# Patient Record
Sex: Female | Born: 1935 | Race: Black or African American | Hispanic: No | Smoking: Never smoker
Health system: Southern US, Community
[De-identification: ages and names within clinical notes are randomized; demographics above are authoritative.]

## PROBLEM LIST (undated history)

## (undated) DIAGNOSIS — M8580 Other specified disorders of bone density and structure, unspecified site: Secondary | ICD-10-CM

## (undated) DIAGNOSIS — M1990 Unspecified osteoarthritis, unspecified site: Secondary | ICD-10-CM

## (undated) DIAGNOSIS — E785 Hyperlipidemia, unspecified: Secondary | ICD-10-CM

## (undated) DIAGNOSIS — I10 Essential (primary) hypertension: Secondary | ICD-10-CM

## (undated) DIAGNOSIS — S7292XA Unspecified fracture of left femur, initial encounter for closed fracture: Secondary | ICD-10-CM

## (undated) DIAGNOSIS — I350 Nonrheumatic aortic (valve) stenosis: Secondary | ICD-10-CM

## (undated) HISTORY — PX: TOTAL HIP ARTHROPLASTY: SHX124

## (undated) HISTORY — PX: ABDOMINAL HYSTERECTOMY: SHX81

---

## 1999-05-14 ENCOUNTER — Ambulatory Visit (HOSPITAL_COMMUNITY): Admission: RE | Admit: 1999-05-14 | Discharge: 1999-05-14 | Payer: Self-pay | Admitting: *Deleted

## 1999-05-25 ENCOUNTER — Ambulatory Visit (HOSPITAL_COMMUNITY): Admission: RE | Admit: 1999-05-25 | Discharge: 1999-05-25 | Payer: Self-pay | Admitting: *Deleted

## 2001-04-17 ENCOUNTER — Ambulatory Visit (HOSPITAL_COMMUNITY): Admission: RE | Admit: 2001-04-17 | Discharge: 2001-04-17 | Payer: Self-pay | Admitting: *Deleted

## 2001-09-07 ENCOUNTER — Encounter: Admission: RE | Admit: 2001-09-07 | Discharge: 2001-09-07 | Payer: Self-pay | Admitting: *Deleted

## 2003-06-20 ENCOUNTER — Inpatient Hospital Stay (HOSPITAL_COMMUNITY): Admission: RE | Admit: 2003-06-20 | Discharge: 2003-06-24 | Payer: Self-pay | Admitting: Orthopaedic Surgery

## 2003-06-24 ENCOUNTER — Inpatient Hospital Stay (HOSPITAL_COMMUNITY)
Admission: RE | Admit: 2003-06-24 | Discharge: 2003-07-03 | Payer: Self-pay | Admitting: Physical Medicine & Rehabilitation

## 2005-04-29 ENCOUNTER — Inpatient Hospital Stay (HOSPITAL_COMMUNITY): Admission: RE | Admit: 2005-04-29 | Discharge: 2005-05-04 | Payer: Self-pay | Admitting: Orthopaedic Surgery

## 2005-04-29 ENCOUNTER — Ambulatory Visit: Payer: Self-pay | Admitting: Physical Medicine & Rehabilitation

## 2005-05-04 ENCOUNTER — Inpatient Hospital Stay
Admission: RE | Admit: 2005-05-04 | Discharge: 2005-05-13 | Payer: Self-pay | Admitting: Physical Medicine & Rehabilitation

## 2007-12-14 ENCOUNTER — Encounter: Admission: RE | Admit: 2007-12-14 | Discharge: 2007-12-14 | Payer: Self-pay | Admitting: Internal Medicine

## 2007-12-20 ENCOUNTER — Encounter: Admission: RE | Admit: 2007-12-20 | Discharge: 2007-12-20 | Payer: Self-pay | Admitting: Internal Medicine

## 2009-01-26 ENCOUNTER — Ambulatory Visit: Payer: Self-pay | Admitting: Internal Medicine

## 2009-01-27 ENCOUNTER — Encounter (INDEPENDENT_AMBULATORY_CARE_PROVIDER_SITE_OTHER): Payer: Self-pay | Admitting: Internal Medicine

## 2009-01-27 ENCOUNTER — Inpatient Hospital Stay (HOSPITAL_COMMUNITY): Admission: EM | Admit: 2009-01-27 | Discharge: 2009-01-30 | Payer: Self-pay | Admitting: Emergency Medicine

## 2009-01-27 ENCOUNTER — Ambulatory Visit: Payer: Self-pay | Admitting: Vascular Surgery

## 2009-01-28 ENCOUNTER — Encounter (INDEPENDENT_AMBULATORY_CARE_PROVIDER_SITE_OTHER): Payer: Self-pay | Admitting: Internal Medicine

## 2011-03-10 LAB — COMPREHENSIVE METABOLIC PANEL
AST: 22 U/L (ref 0–37)
Albumin: 2.9 g/dL — ABNORMAL LOW (ref 3.5–5.2)
CO2: 27 mEq/L (ref 19–32)
Calcium: 8.6 mg/dL (ref 8.4–10.5)
Creatinine, Ser: 0.7 mg/dL (ref 0.4–1.2)
GFR calc non Af Amer: >60 mL/min (ref >60)

## 2011-03-10 LAB — URINE CULTURE

## 2011-03-10 LAB — GLUCOSE, CAPILLARY
Glucose-Capillary: 104 mg/dL — ABNORMAL HIGH (ref 70–99)
Glucose-Capillary: 111 mg/dL — ABNORMAL HIGH (ref 70–99)
Glucose-Capillary: 121 mg/dL — ABNORMAL HIGH (ref 70–99)
Glucose-Capillary: 131 mg/dL — ABNORMAL HIGH (ref 70–99)
Glucose-Capillary: 132 mg/dL — ABNORMAL HIGH (ref 70–99)

## 2011-03-10 LAB — DIFFERENTIAL
Basophils Absolute: 0 10*3/uL (ref 0.0–0.1)
Basophils Absolute: 0 10*3/uL (ref 0.0–0.1)
Basophils Relative: 0 % (ref 0–1)
Eosinophils Relative: 0 % (ref 0–5)
Lymphocytes Relative: 8 % — ABNORMAL LOW (ref 12–46)
Monocytes Relative: 6 % (ref 3–12)
Neutro Abs: 4.3 10*3/uL (ref 1.7–7.7)
Neutro Abs: 9.5 10*3/uL — ABNORMAL HIGH (ref 1.7–7.7)
Neutrophils Relative %: 77 % (ref 43–77)
Neutrophils Relative %: 87 % — ABNORMAL HIGH (ref 43–77)

## 2011-03-10 LAB — URINALYSIS, ROUTINE W REFLEX MICROSCOPIC
Bilirubin Urine: NEGATIVE
Ketones, ur: NEGATIVE mg/dL
Nitrite: NEGATIVE
Specific Gravity, Urine: 1.011 (ref 1.005–1.030)
Urobilinogen, UA: 1 mg/dL (ref 0.0–1.0)
pH: 6.5 (ref 5.0–8.0)

## 2011-03-10 LAB — CBC
HCT: 34.1 % — ABNORMAL LOW (ref 36.0–46.0)
HCT: 39.2 % (ref 36.0–46.0)
MCHC: 34.4 g/dL (ref 30.0–36.0)
MCV: 92.7 fL (ref 78.0–100.0)
Platelets: 106 10*3/uL — ABNORMAL LOW (ref 150–400)
Platelets: 133 10*3/uL — ABNORMAL LOW (ref 150–400)
RBC: 3.87 MIL/uL (ref 3.87–5.11)
RDW: 14.2 % (ref 11.5–15.5)
RDW: 14.6 % (ref 11.5–15.5)
RDW: 14.7 % (ref 11.5–15.5)

## 2011-03-10 LAB — BASIC METABOLIC PANEL
BUN: 11 mg/dL (ref 6–23)
BUN: 15 mg/dL (ref 6–23)
Calcium: 8.5 mg/dL (ref 8.4–10.5)
Calcium: 9.3 mg/dL (ref 8.4–10.5)
Chloride: 104 mEq/L (ref 96–112)
Creatinine, Ser: 0.69 mg/dL (ref 0.4–1.2)
Creatinine, Ser: 0.78 mg/dL (ref 0.4–1.2)
GFR calc Af Amer: >60 mL/min (ref >60)
GFR calc non Af Amer: >60 mL/min (ref >60)
GFR calc non Af Amer: >60 mL/min (ref >60)

## 2011-03-10 LAB — CARDIAC PANEL(CRET KIN+CKTOT+MB+TROPI)
CK, MB: 2.6 ng/mL (ref 0.3–4.0)
CK, MB: 3.9 ng/mL (ref 0.3–4.0)

## 2011-03-10 LAB — TROPONIN I

## 2011-03-10 LAB — CK TOTAL AND CKMB (NOT AT ARMC): CK, MB: 6.1 ng/mL — ABNORMAL HIGH (ref 0.3–4.0)

## 2011-03-10 LAB — TSH: TSH: 1.326 u[IU]/mL (ref 0.350–4.500)

## 2011-03-10 LAB — URINE MICROSCOPIC-ADD ON

## 2011-03-10 LAB — PROTIME-INR
INR: 1 (ref 0.00–1.49)
Prothrombin Time: 13.9 seconds (ref 11.6–15.2)

## 2011-03-10 LAB — LIPID PANEL: Triglycerides: 49 mg/dL (ref <150)
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
## 2011-04-15 NOTE — Op Note (Signed)
[name], STALLONE                ACCOUNT NO.:  [id number]   MEDICAL RECORD NO.:  [id number]          PATIENT TYPE:  INP   LOCATION:  5016                         FACILITY:  [facility]   PHYSICIAN:  Mark C. Ophelia Charter, M.D.    DATE OF BIRTH:  21-Jun-1924   DATE OF PROCEDURE:  DATE OF DISCHARGE:  05/04/2005                                 OPERATIVE REPORT   FINAL DIAGNOSES:  Left hip osteoarthritis.   OPERATION/PROCEDURE:  Left total hip arthroplasty.   ADDITIONAL DIAGNOSES:  1.  Hypertension.  2.  Esophageal reflux.  3.  Anemia.   HISTORY OF PRESENT ILLNESS:  This 77 year old female has progressive left  hip osteoarthritis, painful with chronic pain . She had had fragmentation  and erosion and was admitted for left total hip arthroplasty.  Preoperative  lab work included CBC which was normal.  Normal PT/PTT. Hemoglobin 13.5 on  admission.  Urinalysis was normal.  Chemistry panel was normal.   HOSPITAL COURSE:  The patient was admitted and underwent a cemented femur  with Press-Fit acetabulum.  Dr. Rayburn Ma assisted with the surgery under  general anesthesia.  Estimated blood loss was 200 mL.  Postoperatively she  was on pharmacy Coumadin  protocol, PT/OT postoperative total hip  arthroplasty protocol, care and management arrangements.  She progressed  well.  INR was 2 on 4 mg of Coumadin a day.  Hemoglobin was 9.2 and stable  and arrangements were made with transfer to [facility].  She was slow with therapy  and had bilateral total hip arthroplasty at age 66.  The patient was  transferred in stable condition.  Postoperative x-ray showed good position  of prosthesis.      Mark C. Ophelia Charter, M.D.  Electronically Signed     MCY/MEDQ  D:  07/27/2005  T:  07/28/2005  Job:  [id number]

## 2011-04-15 NOTE — H&P (Signed)
NAMENOHELANI, BENNING NO.:  [id number]   MEDICAL RECORD NO.:  [id number]          PATIENT TYPE:  ORB   LOCATION:  4532                         FACILITY:  [facility]   PHYSICIAN:  Ranelle Oyster, M.D.DATE OF BIRTH:  12-17-1923   DATE OF ADMISSION:  05/04/2005  DATE OF DISCHARGE:                                HISTORY & PHYSICAL   CHIEF COMPLAINT:  Left hip pain.   HISTORY OF PRESENT ILLNESS:  This is a 77 year old black female with a  history of a right total hip replacement in 2004, who went to rehab, who was  admitted on June 2 with end-stage changes in her left hip, failing  conservative care.  The patient underwent a left total hip replacement on  June 2 by Dr. Ophelia Charter.  The patient was placed on Coumadin for DVT  prophylaxis.  She has had some mild postoperative anemia.  She is a mod-  assist for bed mobility, min-assist for gait 20 feet with rolling walker and  total assist for transfers.  She is brought to the subacute rehab unit for  further therapy to reach modified independent goals.   REVIEW OF SYSTEMS:  Positive for reflux, low back pain.  She has been moving  her bowels and bladder regularly.  Appetite is fair.  Denies any shortness  of breath or chest pain.  Full review of systems is in the written H&P.   PAST MEDICAL HISTORY:  Positive for right total hip replacement in 2004,  hypertension, reflux, lipoma excision, hysterectomy, bladder tack.  The  patient does not drink or smoke.   FAMILY HISTORY:  Noncontributory.   SOCIAL HISTORY:  The patient lives alone, and has a granddaughter who can  assist as needed.  She has her own little house with 2 steps to enter.  The  patient is independent with a walker prior to admission.   MEDICATIONS:  Metoprolol and hydrochlorothiazide prior to admission.   ALLERGIES:  None.   LABORATORY DATA:  Hemoglobin 9.1, white count 9, platelets 134, BUN and  creatinine 6 and 0.7.  Sodium 138, potassium 3.8.  INR  2.   PHYSICAL EXAMINATION:  VITAL SIGNS:  Blood pressure 144/63, pulse is 80,  temperature 99.4, respiratory rate 18.  The patient is obese but pleasant  and alert.  HEENT:  Pupils equal, round and reactive to light and accommodation.  Extraocular movements are intact.  Ear, nose and throat are unremarkable.  Oral mucosa is pink and moist.  NECK:  Supple, without JVD or adenopathy.  CHEST:  Clear to auscultation bilaterally without wheezes, rales, or  rhonchi.  HEART:  Regular rate and rhythm without murmur, rubs or gallops.  ABDOMEN:  Soft, nontender.  Bowel sounds are positive.  MUSCULOSKELETAL:  The patient's left hip is clean with mild serosanguineous  discharge.  The wound is well approximated.  Minimal edema.  There is some  bruising around the wound.  The patient has 2/5 strength in the left hip, 3  at the left knee and 4 at the left ankle.  Left upper extremity is 4+/5 and  right upper and lower extremities are 4+/5.  NEUROLOGICAL:  The patient is alert and appropriate.  Has good judgment and  memory.  Cranial nerve exam is normal, as were reflexes and sensation today.   ASSESSMENT AND PLAN:  1.  Functional deficits.  Deficits due to osteoarthritis of left hip status      post left total hip replacement on June 2.  Begin subacute level rehab      with modified independent goals.  Estimated length of stay 7+ days.      Prognosis is good.  2.  Pain management with p.r.n. oxycodone and Robaxin p.r.n. also for muscle      spasm.  3.  DVT prophylaxis with Coumadin.  4.  Postoperative anemia.  Continue iron supplementation.  Follow up with      CBC.  5.  Hypertension.  Lopressor and hydrochlorothiazide.       ZTS/MEDQ  D:  05/04/2005  T:  05/04/2005  Job:  [id number]

## 2011-04-15 NOTE — Op Note (Signed)
NAMEGIONNA, POLAK                ACCOUNT NO.:  [id number]   MEDICAL RECORD NO.:  [id number]          PATIENT TYPE:  INP   LOCATION:  2550                         FACILITY:  [facility]   PHYSICIAN:  Mark C. Ophelia Charter, M.D.    DATE OF BIRTH:  20-Aug-1924   DATE OF PROCEDURE:  04/29/2005  DATE OF DISCHARGE:                                 OPERATIVE REPORT   PREOPERATIVE DIAGNOSIS:  Left hip osteoarthritis.   POSTOPERATIVE DIAGNOSIS:  Left hip osteoarthritis.   PROCEDURE:  Left total hip arthroplasty.   SURGEON:  Mark C. Ophelia Charter, M.D.   ASSISTANT:  Vanita Panda. Magnus Ivan, M.D.   SECOND ASSISTANT:  Lynford Citizen, RNFA.   ESTIMATED BLOOD LOSS:  Less than 200 mL.   DRAINS:  None.   ANESTHESIA:  Marcaine skin local.   COMPONENTS USED:  Dupuy Pentical Marathon acetabular liner 56 mm, no-hole  shell.  Apex hole eliminator.  10 degree lip, 32 mm ball, +9 neck length.  Summit cemented femoral stem, 12/14 taper, standard offset 5 size.  #5  cement plug.  32 mm ball.   DESCRIPTION OF PROCEDURE:  After induction of general anesthesia with  orotracheal intubation, preoperative Ancef prophylaxis, transverse lateral  position with axillary roll, Mark VII frame, standard prepping and draping  was performed.  Using ellipse sheets, drapes, impervious stockinette, Coban,  sterile skin marker, Betadine and Vi-drapes x2 sealing the skin.  Posterior  approach was made.  Gluteus maximus was split.  Piriformis was tagged, cut.  Large Steinmann pin placed in the lateral aspect of the pelvis and small  drill bit in the trochanter parallel, measurement 57 mm.  Posterior capsule  was then T'd.  There was clear fluid in the hip, severe deformity of the  head with enlargement of the head.  Neck was cut after dislocating the hip,  one fingerbreadth above the lesser trochanter.  Spurs were removed and after  resection and evaluation, it appeared that the resection was slightly below  one fingerbreadth by about 3  mm.  Sequential reaming and broaching was  performed up to a 5 which filled the canal well with about a 1 mm cancellous  bone rim left inside the canal.  There were some large osteophytes  posteriorly around the trochanter which were excised.  The acetabulum was  then prepared with sequential reaming up to 55 mm and 56 mm prosthesis was  inserted after trials.  Pressfit acetabulum, hole Apex centralizer, ASIS  prosthetic notch line was used with additional cup flexion which pointed at  the corner of the room with 45 degrees of abduction.  Permanent liner was  inserted.  Pulse lavage, bottle brushing of the canal, vacuum mixing of the  cement.  Cement glue gun with pressurization and placement of the prosthesis  with the centralizer tip.  After the cement was hard, holding the  appropriate anteversion of 15 to 20 degrees on the femoral side, +9 ball was  popped on and hip was reduced.  There was excellent stability identical  trials with trace shuck, flexion to 90.  There were some spurs anteriorly  that had been removed off of the acetabular side which was causing  impingement at 45 degrees of internal rotation as well as the spurs that  were removed off the trochanter and this allowed flexion to 90 degrees,  internal rotation to 90 degrees without hip dislocation.  Quad was not  tight, adductor was not tight.  There was good stability anteriorly with  external rotation.  Repeat irrigation was performed.  Gluteus medius was  used for repair of the piriformis back to the tendon.  Tensor fascia closed,  gluteus maximus fascia reapproximated with 2-0 Vicryl using subcutaneous  tissue.  Skin staple closure, Marcaine infiltration, postoperative dressing,  knee immobilizer, and transferred to the recovery room in stable condition.      MCY/MEDQ  D:  04/29/2005  T:  04/29/2005  Job:  [id number]

## 2011-04-15 NOTE — Op Note (Signed)
[name], [name]                          ACCOUNT NO.:  [id number]   MEDICAL RECORD NO.:  [id number]                   PATIENT TYPE:  INP   LOCATION:  5033                                 FACILITY:  [facility]   PHYSICIAN:  Mark C. Ophelia Charter, M.D.                 DATE OF BIRTH:  08-25-1924   DATE OF PROCEDURE:  06/20/2003  DATE OF DISCHARGE:                                 OPERATIVE REPORT   PREOPERATIVE DIAGNOSES:  1. Right hip osteoarthritis.  2. Adductor contracture.   POSTOPERATIVE DIAGNOSES:  1. Right hip osteoarthritis.  2. Adductor contracture.   PROCEDURE:  Right total hip arthroplasty and percutaneous adductor release.   SURGEON:  Mark C. Ophelia Charter, M.D.   ASSISTANT:  Fransico Michael, P.A.-C.   ANESTHESIA:  GOT.   ESTIMATED BLOOD LOSS:  300 mL.   DESCRIPTION OF PROCEDURE:  After the induction of general anesthesia and  orotracheal intubation, the patient received preoperative Ancef prophylaxis  and was transferred to the lateral decubitus position with axillary roll and  careful padding to the down leg.  Right hip was prepped and the patient was  secured to the [facility] VII frame in the lateral position.  The hip was prepped  with DuraPrep for the usual total hip, drapes and impervious stockinette,  Coban and sterile skin marker, Betadine and ViDrape x2.  Application was  used to seal the lateral hip and the groin.   A posterior approach was made.  The tensor fascia was identified and splint  in line with its fibers and gluteus maximus was splint along with its  fibers.  Piriformis was tagged with #1 Ticron and then cut.  A large diamond  pin was placed in the lateral pelvis and a small drill bit in the greater  trochanter.  Measurement was made which was 6.7 cm.  The patient had a  severe arthritic hip with 1-2 cm lateral osteophytes, collapsed flattening  of the head, and hip was extremely stiff.  Posterior capsule was excised as  well as the superior capsule and gradually  the hip was internally rotated.  The hip was dislocated.  The neck was cut one fingerbreadth above the lesser  trochanter.  On inspection, the cut was in good position.  Soft tissue was  cleaned out of the piriformis fossa.  Canal reamer was used followed by  sequential reaming, sequential broaching up to a #8.  The calcar reamer was  used.  The calcar was smooth.  Trochanteric reamer was used to allow for  lateralization prior to sequential broaching.   Large osteophytes were removed off the acetabulum.  The labrum was excised.  Small sponges were placed down the canal.  Sequential reaming was used on  the acetabular side up to a 56 mm reamer.  This would fully seat.  The fovea  was gone and with the trial sitting, the marginal osteophytes were trimmed  off anterior and posterior using a 1 and 1/2 inch straight and curved  osteotome.  Position was checked with trials.  With a +5, there was  restoration of the length at 67.  There was no shuck.  Full knee flexion  with the hip in extended position.  The hip was stable at 90 degrees of  flexion.  Internal rotation to 80 degrees but there was some subluxation of  the head due to anterior osteophyte impingement.  Further osteophytes were  trimmed to the rim of the cup and this took care of thus giving excellent  stability.  Trials were removed.  Permanent acetabulum was pounded in the  new hole.  Osteonics 56 mm Trident cup was popped in.  Small sponge was  placed to protect the polyethylene.  Pulsatile lavage, vacuum excess cement  gun.  Placement of the cement using glue and cement restrictor.  A #5 cement  restrictor was used, a 15 mm centralizer, __________#8 Osteonics stem.  Appropriate anteversion was placed on the femur.  The ball was popped on.  Cement was hardened, and there were identical findings of stability with  trials.  Measurement was at 70 mm and this was 3 mm longer than the trial.  Piriformis was repaired back to the  gluteus medius.  Tensor fascia was  repaired with 0 Ethibond and 0 Vicryl in the gluteus maximus, fascia with 2-  0 Vicryl subcutaneous tissue.  Adductor still was extremely tight and once  the skin was closed with skin staples, Marcaine infiltration and  postoperative dressing applied.  The patient was transferred to the supine  position.  The groin was prepped and a percutaneous adductor release was  performed with 15 scalpel blade and with pop there was some improvement in  reduction from only 10 degrees out to 45 degrees.  A 4 x 4 dressing was  applied and knee immobilizer.  The patient tolerated the procedure well and  was transferred to the recovery room in stable condition.                                               Mark C. Ophelia Charter, M.D.    MCY/MEDQ  D:  06/20/2003  T:  06/20/2003  Job:  [id number]

## 2011-04-15 NOTE — Discharge Summary (Signed)
[name], [name]                          ACCOUNT NO.:  [id number]   MEDICAL RECORD NO.:  [id number]                   PATIENT TYPE:  INP   LOCATION:  5033                                 FACILITY:  [facility]   PHYSICIAN:  Mark C. Ophelia Charter, M.D.                 DATE OF BIRTH:  1924-11-19   DATE OF ADMISSION:  06/20/2003  DATE OF DISCHARGE:  06/24/2003                                 DISCHARGE SUMMARY   FINAL DIAGNOSES:  1. Status post right total hip arthroplasty June 20, 2003.  2. Hypertension not otherwise specified.  3. Long-term use of anticoagulants.   HISTORY OF PRESENT ILLNESS:  A 77 year old black female presented to our  office with a long history of right hip osteoarthritis and chronic pain.  She had progressively worsening symptoms with no response to conservative  treatment.  Preop x-rays showed bone-on-bone changes with marginal  osteophyte formation and subchondral sclerosis.   LABORATORY DATA:  Preadmission, WBC 5.8, RBC 4.55, hemoglobin 14.0,  hematocrit 40.8, platelets 196.  PT 13.1, INR 1.0, PTT 25.  Sodium 143,  potassium 4.0, chloride 108,  CO2 28, glucose 87, BUN 11, creatinine 0.7,  calcium 9.7, total protein 6.8, albumin 3.6, AST 17, ALT 11, alkaline  phosphatase 99, total bilirubin 0.7.  Urinalysis negative.   HOSPITAL COURSE:  On June 20, 2003, the patient was taken to the operating  room at [facility], and a right total hip arthroplasty procedure  was performed.  Surgeon: Veverly Fells. Ophelia Charter, M.D.  Assistant: Genene Churn. Barry Dienes,  P.A.-C.  Anesthesia: General with local Marcaine postop.  Estimated blood  loss: Less than 400 ml.  There were no surgical or anesthesia complications,  and the patient was transferred to recovery in stable condition.   On June 21, 2003, the patient was stable. Vital signs stable, afebrile.  Leg  lengths equal.  Hemoglobin 10.6.  The patient was started on pharmacy  protocol Coumadin.   On June 22, 2003, vital signs stable,  afebrile.  Potassium 3.4.  Hemoglobin  9.2.  Foley and PCA pump discontinued.  I   On June 23, 2003, the patient was evaluated by care management,  rehabilitation, and OT.  Hemoglobin 9.2.  INR 1.6.  Heparin lock  discontinued.   On June 24, 2003, glucose 117.  T-max 100.3.  Electrolytes normal.  Hemoglobin 8.4.  INR 2.2.  The patient was ready for rehabilitation  transfer.   MEDICATIONS:  Continue all current medications.   DISPOSITION:  Discharge to rehabilitation.   CONDITION ON DISCHARGE:  Good and stable.   DISCHARGE INSTRUCTIONS:  1. The patient will continue to work with physical therapy daily to improve     ambulation and strengthening.  2. She will remain on Coumadin x 3 to 4 weeks postoperatively for DVT     prophylaxis.  3. Dressing changes p.r.n.  4. Staples to be removed two  weeks postoperatively.  5. She will follow up in our office two weeks after discharge from     rehabilitation.  If there are any problems or complications before that     time, we can be notified at [phone].      Genene Churn. Denton Meek.                      Mark C. Ophelia Charter, M.D.    JMO/MEDQ  D:  07/21/2003  T:  07/21/2003  Job:  [id number]

## 2011-04-15 NOTE — Discharge Summary (Signed)
[name], [name] NO.:  [id number]   MEDICAL RECORD NO.:  [id number]          PATIENT TYPE:  ORB   LOCATION:  4532                         FACILITY:  [facility]   PHYSICIAN:  Erick Colace, M.D.DATE OF BIRTH:  1924/11/10   DATE OF ADMISSION:  05/04/2005  DATE OF DISCHARGE:  05/13/2005                                 DISCHARGE SUMMARY   DISCHARGE DIAGNOSES:  1.  Left total hip arthroplasty, April 29, 2005, secondary to osteoarthritis,      pain management.  2.  Coumadin for deep venous thrombosis prophylaxis.  3.  Postoperative anemia.  4.  Hypertension.  5.  History of a right total hip arthroplasty in 2004.   HISTORY OF PRESENT ILLNESS:  A 77 year old female with history of right  total hip replacement in 2004 with inpatient rehab services.  Now admitted  April 29, 2005 with end-stage changes of the left hip and no relief with  conservative care.  Underwent a left total hip arthroplasty May 03, 2005 per  Dr. Ophelia Charter.  Placed on Coumadin for deep venous thrombosis prophylaxis,  weightbearing as tolerated.  Postoperative pain control, noted anemia  postoperatively 9.1. No chest pain, no shortness of breath.  She was  admitted to subacute care services.   PAST MEDICAL HISTORY:  See discharge diagnoses.   HABITS:  Denies alcohol or tobacco.   ALLERGIES:  None.   SOCIAL HISTORY:  Lives alone. Granddaughter to assist as needed.  One-level  home, two steps to entry.   MEDICATIONS PRIOR TO ADMISSION:  1.  Metoprolol 100 mg twice daily.  2.  Hydrochlorothiazide 25 mg daily.   HOSPITAL COURSE:  The patient with progressive gains while on rehabilitation  services with therapies initiated daily.  The following issues are followed  during the patient's rehab course:   Pertaining to [name] left total hip arthroplasty, surgical site healing  nicely.  Staples have been removed.  No signs of infection.  She was  ambulating, minimal guard, 100 feet with a  rolling walker.  Minimal assist  for bed mobility and transfers.  Supervision for activities of daily living  except minimal assist for lower body dressing.  She was weightbearing as  tolerated with hip precautions noted.  She remained on oxycodone as needed  for breakthrough pain with good results.  Coumadin for deep venous  thrombosis prophylaxis with latest INR of 2.3.  She would complete Coumadin  protocol followed by [facility].  Postoperative anemia again stable.  She was asymptomatic.  Latest hemoglobin of 8.4, hematocrit 24.8.  She  remained on iron supplement.  Blood pressure is controlled with Lopressor  and hydrochlorothiazide.  She had undergone a history of a right hip  replacement in 2004 which was without issue during a rehab stay.  She was  discharged to home with home therapies.   DISCHARGE MEDICATIONS:  1.  Coumadin with latest dose 4 mg to be completed on May 29, 2005.  2.  Hydrochlorothiazide 25 mg daily.  3.  Lopressor 100 mg twice daily.  4.  Trinsicon one capsule twice daily.  5.  Potassium chloride 10 mEq daily.  6.  Oxycodone as needed for breakthrough pain.   ACTIVITY:  As tolerated with hip precautions.   DIET:  Regular.   WOUND CARE:  Cleanse incision daily with warm soap and water.  Home health  nurse per [facility] to complete Coumadin protocol.  Home health  physical and occupational therapy.   FOLLOW UP:  She would follow up with Dr. Ophelia Charter as advised for orthopedic  services.       DA/MEDQ  D:  05/12/2005  T:  05/12/2005  Job:  [id number]   cc:   Veverly Fells. Ophelia Charter, M.D.  [address]  [place], [place] [address]  Fax: [phone]   Areatha Keas, M.D.  [address]  [address]  [place]  [place] [address]  Fax: [phone]   Georgianne Fick, M.D.  [address]  [place]  [place] [address]  Fax: [phone]

## 2011-04-15 NOTE — Discharge Summary (Signed)
NAMEKERSTEN, [name]                          ACCOUNT NO.:  [id number]   MEDICAL RECORD NO.:  [id number]                   PATIENT TYPE:  IPS   LOCATION:  4151                                 FACILITY:  [facility]   PHYSICIAN:  Ellwood Dense, M.D.                DATE OF BIRTH:  1924/04/16   DATE OF ADMISSION:  06/24/2003  DATE OF DISCHARGE:  07/03/2003                                 DISCHARGE SUMMARY   DISCHARGE DIAGNOSES:  1. Right total hip arthroplasty July 23 secondary to osteoarthritis.  2. Pain management.  3. Coumadin for deep vein thrombosis prophylaxis.  4. Anemia.  5. Hypertension.   HISTORY OF PRESENT ILLNESS:  A 77 year old black female admitted July 23  with progressive right hip pain secondary to osteoarthritis, no relief with  conservative care. Underwent a right total hip arthroplasty July 23 per Dr.  Ophelia Charter. Placed on Coumadin for deep vein thrombosis prophylaxis, 50% partial  weight bearing. Postoperative anemia 8.4 and monitored. Preoperative chest x-  ray with questionable left upper lobe lung nodule. Followup CT of the chest  July 24 negative for nodule. Total assist bed mobility. Latest chemistry  unremarkable. Admitted for comprehensive rehab program.   PAST MEDICAL HISTORY:  See discharge diagnoses.   PAST SURGICAL HISTORY:  Bladder tacking and hysterectomy.   ALLERGIES:  None.   HABITS:  Denies alcohol or tobacco.   PRIMARY MEDICAL DOCTOR:  Georgianne Fick, M.D.   MEDICATIONS PRIOR TO ADMISSION:  Lopressor, Bextra, hydrochlorothiazide,  Vicodin, and Zantac.   SOCIAL HISTORY:  Lives alone in [place]. Independent with a cane prior to  admission and driving. Works with [employer]. One level home, two steps  to entry. Local family works.   HOSPITAL COURSE:  The patient did well on rehabilitation services with  therapies initiated on a b.i.d. basis. The following issues were followed  during the patient's rehab course. Pertaining to Ms.  Star's right total hip  arthroplasty, surgical site is healing nicely. Total hip precautions. No  signs of infection. Staples have been removed. She continued on Coumadin for  deep vein thrombosis prophylaxis with latest INR of 2.5 and no bleeding  episodes. She would complete Coumadin protocol followed by home health  agency. Pain management adequate with the use of Tylox. Postoperative anemia  stable at 10.4 and hematocrit of 30.5. She had been transfused June 27, 2003  for a hemoglobin of 8.3 secondary to some limited endurance and easily  fatigued. Blood pressure was controlled with Lopressor and  hydrochlorothiazide. She had no bowel or bladder disturbances. Overall for  her functional mobility, she was supervision for ambulation 120 feet,  modified independence with activities of daily living. Home health physical  and occupational therapy have been arranged. She was discharged to home with  recommendations to followup with Dr. Ophelia Charter in one to two weeks.   DISCHARGE MEDICATIONS:  1. Zantac twice daily.  2.  Lopressor 100 mg twice daily.  3. Hydrochlorothiazide 25 mg daily.  4. Os-Cal 500 mg two tablets daily.  5. Tylox as needed.   ACTIVITY:  Partial weight bearing with hip precautions.   DIET:  Regular.   WOUND CARE:  Cleanse incision daily warm soap and water.   SPECIAL INSTRUCTIONS:  Home health physical and occupational therapy. Home  health nurse. Followup INR routine for Coumadin protocol. He should contact  Dr. Ophelia Charter for followup orthopedic services, Dr. Nicholos Johns, medical  management.      Mariam Dollar, P.A.                     Ellwood Dense, M.D.    DA/MEDQ  D:  07/02/2003  T:  07/03/2003  Job:  [id number]   cc:   Veverly Fells. Ophelia Charter, M.D.  [address]  [place], [place] [address]  Fax: [phone]   Georgianne Fick, M.D.  [address]  [place]  [place] [address]  Fax: [phone]

## 2013-09-13 ENCOUNTER — Other Ambulatory Visit (HOSPITAL_COMMUNITY): Payer: Self-pay

## 2013-11-20 ENCOUNTER — Emergency Department (HOSPITAL_COMMUNITY): Payer: Medicare Other

## 2013-11-20 ENCOUNTER — Encounter (HOSPITAL_COMMUNITY): Payer: Self-pay | Admitting: Emergency Medicine

## 2013-11-20 ENCOUNTER — Inpatient Hospital Stay (HOSPITAL_COMMUNITY)
Admission: EM | Admit: 2013-11-20 | Discharge: 2013-11-27 | DRG: 480 | Disposition: A | Discharge status: Skilled Nursing Facility | Payer: Medicare Other | Attending: Internal Medicine | Admitting: Internal Medicine

## 2013-11-20 DIAGNOSIS — S72332A Displaced oblique fracture of shaft of left femur, initial encounter for closed fracture: Secondary | ICD-10-CM

## 2013-11-20 DIAGNOSIS — W19XXXA Unspecified fall, initial encounter: Secondary | ICD-10-CM | POA: Diagnosis present

## 2013-11-20 DIAGNOSIS — I10 Essential (primary) hypertension: Secondary | ICD-10-CM | POA: Diagnosis present

## 2013-11-20 DIAGNOSIS — I359 Nonrheumatic aortic valve disorder, unspecified: Secondary | ICD-10-CM | POA: Diagnosis present

## 2013-11-20 DIAGNOSIS — S72309A Unspecified fracture of shaft of unspecified femur, initial encounter for closed fracture: Secondary | ICD-10-CM

## 2013-11-20 DIAGNOSIS — S72325A Nondisplaced transverse fracture of shaft of left femur, initial encounter for closed fracture: Secondary | ICD-10-CM

## 2013-11-20 DIAGNOSIS — Y831 Surgical operation with implant of artificial internal device as the cause of abnormal reaction of the patient, or of later complication, without mention of misadventure at the time of the procedure: Secondary | ICD-10-CM | POA: Diagnosis present

## 2013-11-20 DIAGNOSIS — E7800 Pure hypercholesterolemia, unspecified: Secondary | ICD-10-CM | POA: Diagnosis present

## 2013-11-20 DIAGNOSIS — D62 Acute posthemorrhagic anemia: Secondary | ICD-10-CM | POA: Diagnosis not present

## 2013-11-20 DIAGNOSIS — T84049A Periprosthetic fracture around unspecified internal prosthetic joint, initial encounter: Principal | ICD-10-CM | POA: Diagnosis present

## 2013-11-20 DIAGNOSIS — Z01810 Encounter for preprocedural cardiovascular examination: Secondary | ICD-10-CM

## 2013-11-20 DIAGNOSIS — E785 Hyperlipidemia, unspecified: Secondary | ICD-10-CM | POA: Diagnosis present

## 2013-11-20 DIAGNOSIS — G9340 Encephalopathy, unspecified: Secondary | ICD-10-CM | POA: Diagnosis not present

## 2013-11-20 DIAGNOSIS — R011 Cardiac murmur, unspecified: Secondary | ICD-10-CM | POA: Diagnosis present

## 2013-11-20 DIAGNOSIS — Z96649 Presence of unspecified artificial hip joint: Secondary | ICD-10-CM

## 2013-11-20 DIAGNOSIS — I350 Nonrheumatic aortic (valve) stenosis: Secondary | ICD-10-CM

## 2013-11-20 DIAGNOSIS — I959 Hypotension, unspecified: Secondary | ICD-10-CM | POA: Diagnosis not present

## 2013-11-20 DIAGNOSIS — Z7982 Long term (current) use of aspirin: Secondary | ICD-10-CM

## 2013-11-20 HISTORY — DX: Essential (primary) hypertension: I10

## 2013-11-20 HISTORY — DX: Unspecified fracture of left femur, initial encounter for closed fracture: S72.92XA

## 2013-11-20 HISTORY — DX: Other specified disorders of bone density and structure, unspecified site: M85.80

## 2013-11-20 HISTORY — DX: Hyperlipidemia, unspecified: E78.5

## 2013-11-20 HISTORY — DX: Unspecified osteoarthritis, unspecified site: M19.90

## 2013-11-20 HISTORY — DX: Nonrheumatic aortic (valve) stenosis: I35.0

## 2013-11-20 LAB — CBC WITH DIFFERENTIAL/PLATELET
Eosinophils Relative: 1 % (ref 0–5)
HCT: 31.3 % — ABNORMAL LOW (ref 36.0–46.0)
Hemoglobin: 10 g/dL — ABNORMAL LOW (ref 12.0–15.0)
Lymphs Abs: 0.9 10*3/uL (ref 0.7–4.0)
MCH: 29.7 pg (ref 26.0–34.0)
Monocytes Relative: 5 % (ref 3–12)
Neutro Abs: 6.6 10*3/uL (ref 1.7–7.7)
Neutrophils Relative %: 83 % — ABNORMAL HIGH (ref 43–77)
RBC: 3.37 MIL/uL — ABNORMAL LOW (ref 3.87–5.11)

## 2013-11-20 LAB — BASIC METABOLIC PANEL
BUN: 22 mg/dL (ref 6–23)
CO2: 28 mEq/L (ref 19–32)
Chloride: 106 mEq/L (ref 96–112)
GFR calc non Af Amer: 46 mL/min — ABNORMAL LOW (ref >90)
Glucose, Bld: 123 mg/dL — ABNORMAL HIGH (ref 70–99)
Potassium: 3.8 mEq/L (ref 3.5–5.1)
Sodium: 144 mEq/L (ref 135–145)

## 2013-11-20 LAB — URINALYSIS, ROUTINE W REFLEX MICROSCOPIC
Bilirubin Urine: NEGATIVE
Glucose, UA: NEGATIVE mg/dL
Leukocytes, UA: NEGATIVE
Nitrite: NEGATIVE
Specific Gravity, Urine: 1.02 (ref 1.005–1.030)
Urobilinogen, UA: 0.2 mg/dL (ref 0.0–1.0)
pH: 5.5 (ref 5.0–8.0)

## 2013-11-20 LAB — URINE MICROSCOPIC-ADD ON

## 2013-11-20 LAB — ABO/RH: ABO/RH(D): O POS

## 2013-11-20 MED ORDER — FENTANYL CITRATE 0.05 MG/ML IJ SOLN
50.0000 ug | Freq: Once | INTRAMUSCULAR | Status: AC
  Administered 2013-11-20: 50 ug via INTRAVENOUS
  Filled 2013-11-20: qty 2

## 2013-11-20 NOTE — H&P (Addendum)
[facility] History and Physical  SANAII CAPORASO VWU:[id number] DOB: [birth year]    PCP:   Georgianne Fick, MD   Chief Complaint: fell and has a left mid femur Fx.  HPI: [name] is an 79 y.o. female with hx of bilateral total hips, hx of heart murmur, had a mechanical fall tonight without LOC, and has a left midshaft femur Fx.  She has pain on her left leg.  X ray showed significantly displace Fx of the femur.  Orthopedics Dr Ophelia Charter was consulted by EDP, planned to do ORIF later today, and asked hospitalist to admit her.  She denied HA, chest pain, SOB, fever, chills, or dysuria.  Work up also included normal renal fx test, no leukocytosis, Hb of 10grams per dL, and clear CXR.  Hospitalist was asked to admit her.    Rewiew of Systems:  Constitutional: Negative for malaise, fever and chills. No significant weight loss or weight gain Eyes: Negative for eye pain, redness and discharge, diplopia, visual changes, or flashes of light. ENMT: Negative for ear pain, hoarseness, nasal congestion, sinus pressure and sore throat. No headaches; tinnitus, drooling, or problem swallowing. Cardiovascular: Negative for chest pain, palpitations, diaphoresis, dyspnea and peripheral edema. ; No orthopnea, PND Respiratory: Negative for cough, hemoptysis, wheezing and stridor. No pleuritic chestpain. Gastrointestinal: Negative for nausea, vomiting, diarrhea, constipation, abdominal pain, melena, blood in stool, hematemesis, jaundice and rectal bleeding.    Genitourinary: Negative for frequency, dysuria, incontinence,flank pain and hematuria; Musculoskeletal: Negative for back pain and neck pain.  Skin: . Negative for pruritus, rash, abrasions, bruising and skin lesion.; ulcerations Neuro: Negative for headache, lightheadedness and neck stiffness. Negative for weakness, altered level of consciousness , altered mental status, extremity weakness, burning feet, involuntary movement, seizure and syncope.   Psych: negative for anxiety, depression, insomnia, tearfulness, panic attacks, hallucinations, paranoia, suicidal or homicidal ideation    History reviewed. No pertinent past medical history.  Past Surgical History  Procedure Laterality Date  . Abdominal hysterectomy    . Total hip arthroplasty Bilateral     Medications:  HOME MEDS: Prior to Admission medications   Medication Sig Start Date End Date Taking? Authorizing Provider  hydrochlorothiazide (HYDRODIURIL) 25 MG tablet Take 25 mg by mouth.  11/08/13  Yes Historical Provider, MD  lisinopril (PRINIVIL,ZESTRIL) 40 MG tablet  11/08/13  Yes Historical Provider, MD  lovastatin (MEVACOR) 40 MG tablet  10/28/13  Yes Historical Provider, MD  metoprolol (LOPRESSOR) 100 MG tablet  11/11/13  Yes Historical Provider, MD  PARoxetine (PAXIL) 20 MG tablet  10/28/13  Yes Historical Provider, MD     Allergies:  No Known Allergies  Social History:   has no tobacco, alcohol, and drug history on file.  Family History: No family history on file.   Physical Exam: Filed Vitals:   11/20/13 2100 11/20/13 2145 11/20/13 2230 11/20/13 2345  BP: 154/65 160/67 171/56 146/51  Pulse: 68 71 69 64  Temp:      TempSrc:      Resp:      Height:      Weight:      SpO2: 94% 100% 96% 98%   Blood pressure 146/51, pulse 64, temperature 98 F (36.7 C), temperature source Oral, resp. rate 21, height 5\' 5"  (1.651 m), weight 72.576 kg (160 lb), SpO2 98.00%.  GEN:  Pleasant  patient lying in the stretcher in no acute distress; cooperative with exam. PSYCH:  alert and oriented x4; does not appear anxious or depressed; affect is appropriate. HEENT:  Mucous membranes pink and anicteric; PERRLA; EOM intact; no cervical lymphadenopathy nor thyromegaly or carotid bruit; no JVD; There were no stridor. Neck is very supple. Breasts:: Not examined CHEST WALL: No tenderness CHEST: Normal respiration, clear to auscultation bilaterally.  HEART: Regular rate and  rhythm.  There is a crescendo-decrescendo murmur at the LSB of a grade 3. BACK: No kyphosis or scoliosis; no CVA tenderness ABDOMEN: soft and non-tender; no masses, no organomegaly, normal abdominal bowel sounds; no pannus; no intertriginous candida. There is no rebound and no distention. Rectal Exam: Not done EXTREMITIES: No bone or joint deformity; age-appropriate arthropathy of the hands and knees; no edema; no ulcerations.  There is no calf tenderness. Genitalia: not examined PULSES: 2+ and symmetric SKIN: Normal hydration no rash or ulceration CNS: Cranial nerves 2-12 grossly intact no focal lateralizing neurologic deficit.  Speech is fluent; uvula elevated with phonation, facial symmetry and tongue midline. DTR are normal bilaterally, cerebella exam is intact, barbinski is negative and strengths are equaled bilaterally.  No sensory loss.   Labs on Admission:  Basic Metabolic Panel:  Recent Labs Lab 11/20/13 2200  NA 144  K 3.8  CL 106  CO2 28  GLUCOSE 123*  BUN 22  CREATININE 1.06  CALCIUM 8.9   Liver Function Tests: No results found for this basename: AST, ALT, ALKPHOS, BILITOT, PROT, ALBUMIN,  in the last 168 hours No results found for this basename: LIPASE, AMYLASE,  in the last 168 hours No results found for this basename: AMMONIA,  in the last 168 hours CBC:  Recent Labs Lab 11/20/13 2200  WBC 7.9  NEUTROABS 6.6  HGB 10.0*  HCT 31.3*  MCV 92.9  PLT 154   Cardiac Enzymes: No results found for this basename: CKTOTAL, CKMB, CKMBINDEX, TROPONINI,  in the last 168 hours  CBG: No results found for this basename: GLUCAP,  in the last 168 hours   Radiological Exams on Admission: Dg Pelvis Portable  11/20/2013   CLINICAL DATA:  Status post fall; left hip pain.  EXAM: PORTABLE PELVIS 1-2 VIEWS  COMPARISON:  None.  FINDINGS: There is a significantly displaced oblique fracture extending across the left mid femoral shaft. This demonstrates approximately 1 shaft width  anterior and likely medial displacement, with 6 cm of shortening at the fracture site. This is just distal to the distal aspect of the left femoral prosthesis. The femoral prostheses are unremarkable in appearance bilaterally. There is no definite evidence of loosening.  The pelvis is difficult to assess due to limitations in positioning and diffuse osteopenia. The visualized bowel gas pattern is grossly unremarkable.  IMPRESSION: 1. Significantly displaced oblique fracture extending across the left mid femoral shaft, with approximately 1 shaft width anterior and likely medial displacement, and 6 cm of shortening at the fracture site. This is just distal to the distal aspect of the left femoral prosthesis. The femoral prostheses are unremarkable in appearance bilaterally. 2. Diffuse osteopenia with regard to the visualized osseous structures.   Electronically Signed   By: Roanna Raider M.D.   On: 11/20/2013 21:11   Dg Chest Port 1 View  11/20/2013   CLINICAL DATA:  Preoperative radiograph.  Left femur fracture.  EXAM: PORTABLE CHEST - 1 VIEW  COMPARISON:  08/22/2013.  FINDINGS: Osteopenia. Heart size upper limits of normal for projection. Tortuous thoracic aorta. Probable ectasia of the ascending thoracic aorta. Healed proximal right humerus fracture with severe posttraumatic right glenohumeral osteoarthritis. There is no airspace disease, pneumothorax or pleural effusion.  IMPRESSION:  No acute cardiopulmonary disease.  Chronic changes of the chest.   Electronically Signed   By: Andreas Newport M.D.   On: 11/20/2013 22:30   Dg Femur Left Port  11/20/2013   CLINICAL DATA:  Femur fracture  EXAM: PORTABLE LEFT FEMUR - 2 VIEW  COMPARISON:  Prior radiograph from 06/05/2013  FINDINGS: There is an acute oblique fracture through the midshaft of the left femur with medial angulation and anterior displacement. There is foreshortening with approximately 10 cm of overlap.  A left total hip arthroplasty is grossly  intact without evidence of failure or complication. Diffuse osteopenia. Prominent vascular calcifications within the leg. Severe degenerative osteoarthrosis noted at the visualized knee.  IMPRESSION: 1. Acute oblique fracture through the midshaft of the left femur with anteromedial displacement, medial angulation, and approximately 10 cm of foreshortening/overlap.  2.   Grossly intact left total hip arthroplasty.   Electronically Signed   By: Rise Mu M.D.   On: 11/20/2013 22:37   Assessment/Plan Present on Admission:  . HTN (hypertension) . Systolic murmur . Hypercholesterolemia Left displaced fracture of mid femur.  PLAN:  Will admit for left femur fracture.  I have continued her HTN meds.  With respect to her murmur, will need to obtain an ECHO.  She will also needs and EKG.  Will place her NPO, IVF, pain meds, anticipate ORIF tomorrow.  If her ECHO is acceptable (suspect AS or aortic slerosis), and EKG is OK, she is clear for surgery, accepting increased peri operative cardiovascular risk.  I also was able to confirm tonight that she is a full code.  We will honor her wishes.   Thank you for allowing me to participate in her care.  Other plans as per orders.  Code Status: FULL Unk Lightning, MD. [facility] Pager [phone] 7pm to 7am.  11/20/2013, 11:57 PM

## 2013-11-20 NOTE — ED Provider Notes (Signed)
CSN: [id number]     Arrival date & time 11/20/13  1931 History   First MD Initiated Contact with Patient 11/20/13 1954     Chief Complaint  Patient presents with  . Hip Pain  . Fall   (Consider location/radiation/quality/duration/timing/severity/associated sxs/prior Treatment) Patient is a 79 y.o. female presenting with hip pain and fall. The history is provided by the patient.  Hip Pain  Fall  She states that she thought she got up to change gentleman television. She is complaining of an injury to her left hip. 2 status post left hip replacement. She denies other injury. It is severe and she rates at 10/10. She came in by ambulance. She denies loss of consciousness. Of note, she also had a fall 3 weeks ago without significant injury.  History reviewed. No pertinent past medical history. Past Surgical History  Procedure Laterality Date  . Abdominal hysterectomy    . Total hip arthroplasty Bilateral    No family history on file. History  Substance Use Topics  . Smoking status: Not on file  . Smokeless tobacco: Not on file  . Alcohol Use: Not on file   OB History   Grav Para Term Preterm Abortions TAB SAB Ect Mult Living                 Review of Systems  All other systems reviewed and are negative.    Allergies  Review of patient's allergies indicates no known allergies.  Home Medications  No current outpatient prescriptions on file. BP 178/61  Pulse 67  Temp(Src) 98 F (36.7 C) (Oral)  Resp 21  Ht 5\' 5"  (1.651 m)  Wt 160 lb (72.576 kg)  BMI 26.63 kg/m2  SpO2 94% Physical Exam  Nursing note and vitals reviewed.  79 year old female, resting comfortably and in no acute distress. Vital signs are significant for hypertension with blood pressure 170/61, and mild tachypnea with respiratory rate of 21. Oxygen saturation is 94%, which is normal. Head is normocephalic and atraumatic. PERRLA, EOMI. Oropharynx is clear. Neck is nontender and supple without adenopathy or  JVD. Back is nontender and there is no CVA tenderness. Lungs are clear without rales, wheezes, or rhonchi. Chest is nontender. Heart has regular rate and rhythm with 3/6 systolic ejection murmur. Abdomen is soft, flat, nontender without masses or hepatosplenomegaly and peristalsis is normoactive. Extremities: Left leg is internally rotated. There is pain on any passive range of motion. Dorsalis pedis pulses palpable and there is normal sensation and normal capillary refill. Skin is warm and dry without rash. Neurologic: Mental status is normal, cranial nerves are intact, there are no motor or sensory deficits.  ED Course  Procedures (including critical care time) Labs Review Results for orders placed during the hospital encounter of 11/20/13  CBC WITH DIFFERENTIAL      Result Value Range   WBC 7.9  4.0 - 10.5 K/uL   RBC 3.37 (*) 3.87 - 5.11 MIL/uL   Hemoglobin 10.0 (*) 12.0 - 15.0 g/dL   HCT 40.9 (*) 81.1 - 91.4 %   MCV 92.9  78.0 - 100.0 fL   MCH 29.7  26.0 - 34.0 pg   MCHC 31.9  30.0 - 36.0 g/dL   RDW 78.2  95.6 - 21.3 %   Platelets 154  150 - 400 K/uL   Neutrophils Relative % 83 (*) 43 - 77 %   Neutro Abs 6.6  1.7 - 7.7 K/uL   Lymphocytes Relative 11 (*) 12 - 46 %  Lymphs Abs 0.9  0.7 - 4.0 K/uL   Monocytes Relative 5  3 - 12 %   Monocytes Absolute 0.4  0.1 - 1.0 K/uL   Eosinophils Relative 1  0 - 5 %   Eosinophils Absolute 0.0  0.0 - 0.7 K/uL   Basophils Relative 0  0 - 1 %   Basophils Absolute 0.0  0.0 - 0.1 K/uL  BASIC METABOLIC PANEL      Result Value Range   Sodium 144  135 - 145 mEq/L   Potassium 3.8  3.5 - 5.1 mEq/L   Chloride 106  96 - 112 mEq/L   CO2 28  19 - 32 mEq/L   Glucose, Bld 123 (*) 70 - 99 mg/dL   BUN 22  6 - 23 mg/dL   Creatinine, Ser 9.14  0.50 - 1.10 mg/dL   Calcium 8.9  8.4 - 78.2 mg/dL   GFR calc non Af Amer 46 (*) >90 mL/min   GFR calc Af Amer 53 (*) >90 mL/min  URINALYSIS, ROUTINE W REFLEX MICROSCOPIC      Result Value Range   Color, Urine  YELLOW  YELLOW   APPearance CLEAR  CLEAR   Specific Gravity, Urine 1.020  1.005 - 1.030   pH 5.5  5.0 - 8.0   Glucose, UA NEGATIVE  NEGATIVE mg/dL   Hgb urine dipstick SMALL (*) NEGATIVE   Bilirubin Urine NEGATIVE  NEGATIVE   Ketones, ur NEGATIVE  NEGATIVE mg/dL   Protein, ur NEGATIVE  NEGATIVE mg/dL   Urobilinogen, UA 0.2  0.0 - 1.0 mg/dL   Nitrite NEGATIVE  NEGATIVE   Leukocytes, UA NEGATIVE  NEGATIVE  URINE MICROSCOPIC-ADD ON      Result Value Range   Squamous Epithelial / LPF RARE  RARE   WBC, UA 0-2  <3 WBC/hpf   RBC / HPF 0-2  <3 RBC/hpf   Bacteria, UA RARE  RARE  TYPE AND SCREEN      Result Value Range   ABO/RH(D) O POS     Antibody Screen NEG     Sample Expiration 11/23/2013    ABO/RH      Result Value Range   ABO/RH(D) O POS     Imaging Review Dg Pelvis Portable  11/20/2013   CLINICAL DATA:  Status post fall; left hip pain.  EXAM: PORTABLE PELVIS 1-2 VIEWS  COMPARISON:  None.  FINDINGS: There is a significantly displaced oblique fracture extending across the left mid femoral shaft. This demonstrates approximately 1 shaft width anterior and likely medial displacement, with 6 cm of shortening at the fracture site. This is just distal to the distal aspect of the left femoral prosthesis. The femoral prostheses are unremarkable in appearance bilaterally. There is no definite evidence of loosening.  The pelvis is difficult to assess due to limitations in positioning and diffuse osteopenia. The visualized bowel gas pattern is grossly unremarkable.  IMPRESSION: 1. Significantly displaced oblique fracture extending across the left mid femoral shaft, with approximately 1 shaft width anterior and likely medial displacement, and 6 cm of shortening at the fracture site. This is just distal to the distal aspect of the left femoral prosthesis. The femoral prostheses are unremarkable in appearance bilaterally. 2. Diffuse osteopenia with regard to the visualized osseous structures.    Electronically Signed   By: Roanna Raider M.D.   On: 11/20/2013 21:11   Dg Chest Port 1 View  11/20/2013   CLINICAL DATA:  Preoperative radiograph.  Left femur fracture.  EXAM: PORTABLE CHEST -  1 VIEW  COMPARISON:  08/22/2013.  FINDINGS: Osteopenia. Heart size upper limits of normal for projection. Tortuous thoracic aorta. Probable ectasia of the ascending thoracic aorta. Healed proximal right humerus fracture with severe posttraumatic right glenohumeral osteoarthritis. There is no airspace disease, pneumothorax or pleural effusion.  IMPRESSION: No acute cardiopulmonary disease.  Chronic changes of the chest.   Electronically Signed   By: Andreas Newport M.D.   On: 11/20/2013 22:30   Dg Femur Left Port  11/20/2013   CLINICAL DATA:  Femur fracture  EXAM: PORTABLE LEFT FEMUR - 2 VIEW  COMPARISON:  Prior radiograph from 06/05/2013  FINDINGS: There is an acute oblique fracture through the midshaft of the left femur with medial angulation and anterior displacement. There is foreshortening with approximately 10 cm of overlap.  A left total hip arthroplasty is grossly intact without evidence of failure or complication. Diffuse osteopenia. Prominent vascular calcifications within the leg. Severe degenerative osteoarthrosis noted at the visualized knee.  IMPRESSION: 1. Acute oblique fracture through the midshaft of the left femur with anteromedial displacement, medial angulation, and approximately 10 cm of foreshortening/overlap.  2.   Grossly intact left total hip arthroplasty.   Electronically Signed   By: Rise Mu M.D.   On: 11/20/2013 22:37   Images viewed by me.   Date: 11/20/2013  Rate: 68  Rhythm: normal sinus rhythm  QRS Axis: normal  Intervals: normal  ST/T Wave abnormalities: normal  Conduction Disutrbances:none  Narrative Interpretation: Normal ECG. No prior ECG available for comparison.  Old EKG Reviewed: none available    MDM   1. Closed nondisplaced transverse fracture of  shaft of left femur, initial encounter    Probable dislocation of prosthetic hip. X-ray will be obtained.  X-ray x-ray shows a fracture of the shaft of the femur just distal to the end of the prosthesis. Case has been discussed with Dr. Jillyn Hidden who is on call for Dr. Ophelia Charter who did the original surgery and has agreed to operate on the patient. He treats it is likely to operate tomorrow and requested the patient be admitted to the hospitalist service. Case is discussed with Dr. Conley Rolls of [facility] who agrees to admit the patient  Dione Booze, MD 11/20/13 2325

## 2013-11-20 NOTE — ED Notes (Signed)
Per EMS, patient fell from standing position and landed on left hip. Left leg shortening and rotation noted. Given 50 fentanyl by EMS. Denies LOC, head/neck/back pain.

## 2013-11-21 ENCOUNTER — Encounter (HOSPITAL_COMMUNITY): Payer: Self-pay | Admitting: Internal Medicine

## 2013-11-21 DIAGNOSIS — Z01810 Encounter for preprocedural cardiovascular examination: Secondary | ICD-10-CM

## 2013-11-21 DIAGNOSIS — I359 Nonrheumatic aortic valve disorder, unspecified: Secondary | ICD-10-CM

## 2013-11-21 DIAGNOSIS — I350 Nonrheumatic aortic (valve) stenosis: Secondary | ICD-10-CM | POA: Diagnosis present

## 2013-11-21 LAB — FOLATE: Folate: 7.9 ng/mL

## 2013-11-21 LAB — URINE CULTURE
Colony Count: NO GROWTH
Culture: NO GROWTH

## 2013-11-21 LAB — IRON AND TIBC
Iron: 20 ug/dL — ABNORMAL LOW (ref 42–135)
Saturation Ratios: 10 % — ABNORMAL LOW (ref 20–55)
TIBC: 208 ug/dL — ABNORMAL LOW (ref 250–470)
UIBC: 188 ug/dL (ref 125–400)

## 2013-11-21 LAB — VITAMIN B12: Vitamin B-12: 502 pg/mL (ref 211–911)

## 2013-11-21 LAB — SURGICAL PCR SCREEN: Staphylococcus aureus: NEGATIVE

## 2013-11-21 MED ORDER — SODIUM CHLORIDE 0.9 % IV SOLN: 250.0000 mL | INTRAVENOUS | Status: DC | PRN

## 2013-11-21 MED ORDER — ONDANSETRON HCL 4 MG PO TABS: 4.0000 mg | ORAL_TABLET | Freq: Four times a day (QID) | ORAL | Status: DC | PRN

## 2013-11-21 MED ORDER — METOPROLOL TARTRATE 50 MG PO TABS
50.0000 mg | ORAL_TABLET | Freq: Two times a day (BID) | ORAL | Status: DC
  Administered 2013-11-21 (×2): 50 mg via ORAL
  Filled 2013-11-21 (×4): qty 1

## 2013-11-21 MED ORDER — SODIUM CHLORIDE 0.9 % IJ SOLN: 3.0000 mL | INTRAMUSCULAR | Status: DC | PRN

## 2013-11-21 MED ORDER — LISINOPRIL 40 MG PO TABS
40.0000 mg | ORAL_TABLET | Freq: Every day | ORAL | Status: DC
  Administered 2013-11-21: 40 mg via ORAL
  Filled 2013-11-21 (×2): qty 1

## 2013-11-21 MED ORDER — ONDANSETRON HCL 4 MG/2ML IJ SOLN: 4.0000 mg | Freq: Three times a day (TID) | INTRAMUSCULAR | Status: DC | PRN

## 2013-11-21 MED ORDER — PAROXETINE HCL 20 MG PO TABS
20.0000 mg | ORAL_TABLET | Freq: Every day | ORAL | Status: DC
  Administered 2013-11-21: 20 mg via ORAL
  Filled 2013-11-21 (×2): qty 1

## 2013-11-21 MED ORDER — ONDANSETRON HCL 4 MG/2ML IJ SOLN: 4.0000 mg | Freq: Four times a day (QID) | INTRAMUSCULAR | Status: DC | PRN

## 2013-11-21 MED ORDER — DOCUSATE SODIUM 100 MG PO CAPS
100.0000 mg | ORAL_CAPSULE | Freq: Two times a day (BID) | ORAL | Status: DC
  Administered 2013-11-21 (×2): 100 mg via ORAL
  Filled 2013-11-21 (×4): qty 1

## 2013-11-21 MED ORDER — HYDROMORPHONE HCL PF 1 MG/ML IJ SOLN
1.0000 mg | INTRAMUSCULAR | Status: DC | PRN
  Administered 2013-11-21 (×4): 1 mg via INTRAVENOUS
  Filled 2013-11-21 (×4): qty 1

## 2013-11-21 MED ORDER — SIMVASTATIN 20 MG PO TABS
20.0000 mg | ORAL_TABLET | Freq: Every day | ORAL | Status: DC
  Administered 2013-11-21: 20 mg via ORAL
  Filled 2013-11-21 (×2): qty 1

## 2013-11-21 MED ORDER — SODIUM CHLORIDE 0.9 % IJ SOLN: 3.0000 mL | Freq: Two times a day (BID) | INTRAMUSCULAR | Status: DC

## 2013-11-21 MED ORDER — HYDROCODONE-ACETAMINOPHEN 5-325 MG PO TABS
1.0000 | ORAL_TABLET | ORAL | Status: AC | PRN
  Administered 2013-11-21: 2 via ORAL
  Filled 2013-11-21: qty 2

## 2013-11-21 MED ORDER — ASPIRIN EC 81 MG PO TBEC
81.0000 mg | DELAYED_RELEASE_TABLET | Freq: Every day | ORAL | Status: DC
  Administered 2013-11-21: 81 mg via ORAL
  Filled 2013-11-21 (×2): qty 1

## 2013-11-21 NOTE — Consult Note (Signed)
CARDIOLOGY CONSULT NOTE   Patient ID: TENIYA FILTER MRN: [id number], DOB/AGE: 04/02/1924   Admit date: 11/20/2013 Date of Consult: 11/21/2013  Primary Physician: Georgianne Fick, MD Primary Cardiologist: new to  - seen by Lovena Neighbours, MD   Pt. Profile  79 y/o female without prior cardiac hx who was admitted yesterday following mechanical fall with left femur fx, whom we've been asked evaluate secondary to finding of severe aortic stenosis and need for preoperative cardiac risk stratification.  Problem List  Past Medical History  Diagnosis Date  . Severe aortic stenosis     a. 10/2013 Echo: EF 65-70%, mild conc LVH, Gr 1 DD, sev AS, mild to mod AI, Valve area: 0.89cm^2 (VTI), 0.85cm^2 (Vmax), mildly dil LA, mildly dil RA, PASP .  . Osteoarthritis     a. 04/2005 s/p L total hip arthroplasty  . Osteopenia   . Femur fracture, left     a. 10/2013 Acute oblique Fx through te midshaft of the left femur w/ anteromedial displacement, medial angulation, and ~ 10 cm foreshortening/overlap.  Marland Kitchen Hypertension   . Hyperlipidemia     Past Surgical History  Procedure Laterality Date  . Abdominal hysterectomy    . Total hip arthroplasty Bilateral     Allergies  No Known Allergies  HPI   79 y/o female with a h/o HTN/HL, which has been managed by her PCP.  She has no prior h/o CAD.  She says that she has a long h/o a heart murmur.  An echo in 2010 showed normal LV fxn and mild to mod AS along with end-systolic LV mid-cavity obliteration.  She lives by herself in [place], though family is nearby.  She is fairly independent and still drives.  She does experience DOE at higher levels of activity but has never experienced chest pain.  She believes that she could walk about 100 yds on a flat surface, or vacuum an entire room w/o having to stop and rest as a result of dyspnea.  Yesterday, she got up around 7PM to shut off her TV.  Upon turning back to her bed, she thinks that her foot got caught up  on the floor and she fell forward.  She remembers falling and is clear that she did not lose consciousness.  She did try to break her fall by putting her arms out.  She struck the floor and immediately noted severe left thigh pain and was unable to get up off of the floor. She made her way to the phone and called her dtr.  Her dtr then called EMS and the patient was taken to [facility] ED where she was found to have a left femur fx.  She was admitted by internal medicine and found to have a systolic murmur on exam.  2D echo was performed and shows severe AS.  She has also been seen by ortho with tentative plans for surgery tomorrow.  We have been asked to provide preoperative cardiac risk evaluation.  Inpatient Medications  . aspirin EC  81 mg Oral Daily  . docusate sodium  100 mg Oral BID  . lisinopril  40 mg Oral Daily  . metoprolol  50 mg Oral BID  . PARoxetine  20 mg Oral Daily  . simvastatin  20 mg Oral q1800  . sodium chloride  3 mL Intravenous Q12H    Family History Family History  Problem Relation Age of Onset  . Heart attack Mother     MI in her 43's  .  Stroke Father     died in his 49's.     Social History History   Social History  . Marital Status: Divorced    Spouse Name: N/A    Number of Children: N/A  . Years of Education: N/A   Occupational History  . Not on file.   Social History Main Topics  . Smoking status: Never Smoker   . Smokeless tobacco: Not on file  . Alcohol Use: No  . Drug Use: No  . Sexual Activity: Not on file   Other Topics Concern  . Not on file   Social History Narrative   Lives by herself in [place].  Her dtr and niece help out when needed.  Patient is independent and still drives.  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     Review of Systems  General:  No chills, fever, night sweats or weight changes.  Cardiovascular:  No chest pain, +++ dyspnea on exertion, no edema,  orthopnea, palpitations, paroxysmal nocturnal dyspnea. Dermatological: No rash, lesions/masses Respiratory: No cough, dyspnea Urologic: No hematuria, dysuria Abdominal:   No nausea, vomiting, diarrhea, bright red blood per rectum, melena, or hematemesis Neurologic:  No visual changes, wkns, changes in mental status. MSK:  +++ left thigh pain. All other systems reviewed and are otherwise negative except as noted above.  Physical Exam  Blood pressure 140/58, pulse 57, temperature 98 F (36.7 C), temperature source Oral, resp. rate 18, height 5\' 5"  (1.651 m), weight 160 lb (72.576 kg), SpO2 99.00%.  General: Pleasant, NAD Psych: Normal affect. Neuro: Alert and oriented X 3. Moves all extremities spontaneously. HEENT: Normal  Neck: Supple without JVD.  bilat radiated murmur. Lungs:  Resp regular and unlabored, CTA. Heart: RRR no s3, s4, 3/6 SEM loudest @ rusb but heard throughout.  Audible S2.  2/6 diast murmur @ base. Abdomen: Soft, non-tender, non-distended, BS + x 4.  Extremities: No clubbing, cyanosis or edema. DP/PT/Radials 2+ and equal bilaterally.  Labs  Lab Results  Component Value Date   WBC 7.9 11/20/2013   HGB 10.0* 11/20/2013   HCT 31.3* 11/20/2013   MCV 92.9 11/20/2013   PLT 154 11/20/2013     Recent Labs Lab 11/20/13 2200  NA 144  K 3.8  CL 106  CO2 28  BUN 22  CREATININE 1.06  CALCIUM 8.9  GLUCOSE 123*   Radiology/Studies  Dg Pelvis Portable  11/20/2013   CLINICAL DATA:  Status post fall; left hip pain.  EXAM: PORTABLE PELVIS 1-2 VIEWS  COMPARISON:  None.  FINDINGS: There is a significantly displaced oblique fracture extending across the left mid femoral shaft. This demonstrates approximately 1 shaft width anterior and likely medial displacement, with 6 cm of shortening at the fracture site. This is just distal to the distal aspect of the left femoral prosthesis. The femoral prostheses are unremarkable in appearance bilaterally. There is no definite  evidence of loosening.  The pelvis is difficult to assess due to limitations in positioning and diffuse osteopenia. The visualized bowel gas pattern is grossly unremarkable.  IMPRESSION: 1. Significantly displaced oblique fracture extending across the left mid femoral shaft, with approximately 1 shaft width anterior and likely medial displacement, and 6 cm of shortening at the fracture site. This is just distal to the distal aspect of the left femoral prosthesis. The femoral prostheses are unremarkable in appearance bilaterally. 2. Diffuse osteopenia with regard to the visualized osseous structures.   Electronically  Signed   By: Roanna Raider M.D.   On: 11/20/2013 21:11   Dg Chest Port 1 View  11/20/2013   CLINICAL DATA:  Preoperative radiograph.  Left femur fracture.  EXAM: PORTABLE CHEST - 1 VIEW  COMPARISON:  08/22/2013.  FINDINGS: Osteopenia. Heart size upper limits of normal for projection. Tortuous thoracic aorta. Probable ectasia of the ascending thoracic aorta. Healed proximal right humerus fracture with severe posttraumatic right glenohumeral osteoarthritis. There is no airspace disease, pneumothorax or pleural effusion.  IMPRESSION: No acute cardiopulmonary disease.  Chronic changes of the chest.   Electronically Signed   By: Andreas Newport M.D.   On: 11/20/2013 22:30   Dg Femur Left Port  11/20/2013   CLINICAL DATA:  Femur fracture  EXAM: PORTABLE LEFT FEMUR - 2 VIEW  COMPARISON:  Prior radiograph from 06/05/2013  FINDINGS: There is an acute oblique fracture through the midshaft of the left femur with medial angulation and anterior displacement. There is foreshortening with approximately 10 cm of overlap.  A left total hip arthroplasty is grossly intact without evidence of failure or complication. Diffuse osteopenia. Prominent vascular calcifications within the leg. Severe degenerative osteoarthrosis noted at the visualized knee.  IMPRESSION: 1. Acute oblique fracture through the midshaft of the  left femur with anteromedial displacement, medial angulation, and approximately 10 cm of foreshortening/overlap.  2.   Grossly intact left total hip arthroplasty.   Electronically Signed   By: Rise Mu M.D.   On: 11/20/2013 22:37   ECG  Rsr, 68, lvh, no acute st/t changes.  ASSESSMENT AND PLAN  1. Severe Aortic Stenosis:  Pt says that she has a long h/o a murmur.  AS was mild to mod in 2010, and severe by echo yesterday.  At baseline, she is independent and functions pretty well.  She has never had chest pain, presyncope/syncope, and only experiences dyspnea at higher levels of activity.  LV fxn was normal on echo.  Recommend proceeding with surgery while understanding that she is moderate to high risk for cardiac complication given advanced age and severe AS.  We will have to watch her volume status closely.  She is currently euvolemic.  Cont bb/acei/asa/statin therapy.  2.  L Femur Fracture:  Pending surgery tomorrow.  As above, proceed to surgery w/o additional cardiac testing.  Watch volume closely.  3.  HTN:  Stable.  Cont bb/acei.  4.  HL:  Cont statin therapy.  5.  Anemia:  Follow.  Signed, Nicolasa Ducking, NP 11/21/2013, 2:27 PM Patient seen and examined. I agree with the assessment and plan as detailed above. See also my additional thoughts below.   The patient fell. She did not have syncope.  I spoke with the patient's family. I reviewed all the information with Mr. Brion Aliment. At this time we feel that the patient does not have significant symptoms from her aortic stenosis. The peak velocity is slightly above 4 m/s. The mean gradient is 33 mm mercury. This is certainly consistent with severe aortic stenosis. It does not appear to be critical. I do believe the second heart sound is still present. The patient is cleared for hip surgery. Careful attention must be paid to her volume status. Since it is documented that there has been change in the severity of her aortic  stenosis since 2010, it will be important to follow her carefully going forward to see if it becomes appropriate to intervene on her aortic valve. She should be seen by cardiology in follow up over time.  She needs an echo no more than 12 months from now.  Willa Rough, MD, [facility] 11/21/2013 3:14 PM

## 2013-11-21 NOTE — Progress Notes (Signed)
Echocardiogram 2D Echocardiogram has been performed.  [name] 11/21/2013, 10:36 AM

## 2013-11-21 NOTE — Progress Notes (Signed)
[facility] PROGRESS NOTE  [name] VHQ:[id number] DOB: 1924/07/05 DOA: 11/20/2013 PCP: Georgianne Fick, MD  Assessment/Plan: #1left. Prosthetic femur fracture below hip prosthesis Secondary to mechanical fall. Patient has been seen by orthopedics and anticipate doing surgery tomorrow morning. Patient currently in Buck's traction. Pain management. Per orthopedics.  #2 severe aortic stenosis 2-D echo consistent with severe aortic stenosis. Mean gradient and peak gradients have increased his last 2-D echo. We'll consult with cardiology for further evaluation and management.  #3 hypertension Stable. Continue lisinopril and metoprolol.  #4 hyperlipidemia Continue Zocor.  #5 prophylaxis SCDs for DVT prophylaxis.  Code Status: full Family Communication: updated patient no family present. Disposition Plan: SNF versus home with home health pending surgery   Consultants:  Orthopedics: Dr. Ophelia Charter  Cardiology pending  Procedures:  2-D echo 11/21/2013  Chest x-ray 11/20/2013  X-ray of the left femur 11/20/2013  Antibiotics:  none  HPI/Subjective: Patient with no complaints.  Objective: Filed Vitals:   11/21/13 0519  BP: 140/58  Pulse: 57  Temp: 98 F (36.7 C)  Resp: 18    Intake/Output Summary (Last 24 hours) at 11/21/13 1322 Last data filed at 11/21/13 1200  Gross per 24 hour  Intake    240 ml  Output    550 ml  Net   -310 ml   Filed Weights   11/20/13 1944  Weight: 72.576 kg (160 lb)    Exam:   General:  NAD  Cardiovascular: RRR WITH 3/6 sem  Respiratory: clea to auscultation bilaterally. No wheezes, no crackles, no rhonchi  Abdomen: soft, nontender, nondistended, positive bowel sounds.  Musculoskeletal: no clubbing cyanosis or edema.left lower extremity in Buck's traction  Data Reviewed: Basic Metabolic Panel:  Recent Labs Lab 11/20/13 2200  NA 144  K 3.8  CL 106  CO2 28  GLUCOSE 123*  BUN 22  CREATININE 1.06  CALCIUM  8.9   Liver Function Tests: No results found for this basename: AST, ALT, ALKPHOS, BILITOT, PROT, ALBUMIN,  in the last 168 hours No results found for this basename: LIPASE, AMYLASE,  in the last 168 hours No results found for this basename: AMMONIA,  in the last 168 hours CBC:  Recent Labs Lab 11/20/13 2200  WBC 7.9  NEUTROABS 6.6  HGB 10.0*  HCT 31.3*  MCV 92.9  PLT 154   Cardiac Enzymes: No results found for this basename: CKTOTAL, CKMB, CKMBINDEX, TROPONINI,  in the last 168 hours BNP (last 3 results) No results found for this basename: PROBNP,  in the last 8760 hours CBG: No results found for this basename: GLUCAP,  in the last 168 hours  Recent Results (from the past 240 hour(s))  SURGICAL PCR SCREEN     Status: None   Collection Time    11/21/13  2:44 AM      Result Value Range Status   MRSA, PCR NEGATIVE  NEGATIVE Final   Staphylococcus aureus NEGATIVE  NEGATIVE Final   Comment:            The Xpert SA Assay (FDA     approved for NASAL specimens     in patients over 77 years of age),     is one component of     a comprehensive surveillance     program.  Test performance has     been validated by [employer] for patients greater     than 77 year old or equal to 77 year old.     It is not intended  to diagnose infection nor to     guide or monitor treatment.     Studies: Dg Pelvis Portable  11/20/2013   CLINICAL DATA:  Status post fall; left hip pain.  EXAM: PORTABLE PELVIS 1-2 VIEWS  COMPARISON:  None.  FINDINGS: There is a significantly displaced oblique fracture extending across the left mid femoral shaft. This demonstrates approximately 1 shaft width anterior and likely medial displacement, with 6 cm of shortening at the fracture site. This is just distal to the distal aspect of the left femoral prosthesis. The femoral prostheses are unremarkable in appearance bilaterally. There is no definite evidence of loosening.  The pelvis is difficult to assess due to  limitations in positioning and diffuse osteopenia. The visualized bowel gas pattern is grossly unremarkable.  IMPRESSION: 1. Significantly displaced oblique fracture extending across the left mid femoral shaft, with approximately 1 shaft width anterior and likely medial displacement, and 6 cm of shortening at the fracture site. This is just distal to the distal aspect of the left femoral prosthesis. The femoral prostheses are unremarkable in appearance bilaterally. 2. Diffuse osteopenia with regard to the visualized osseous structures.   Electronically Signed   By: Roanna Raider M.D.   On: 11/20/2013 21:11   Dg Chest Port 1 View  11/20/2013   CLINICAL DATA:  Preoperative radiograph.  Left femur fracture.  EXAM: PORTABLE CHEST - 1 VIEW  COMPARISON:  08/22/2013.  FINDINGS: Osteopenia. Heart size upper limits of normal for projection. Tortuous thoracic aorta. Probable ectasia of the ascending thoracic aorta. Healed proximal right humerus fracture with severe posttraumatic right glenohumeral osteoarthritis. There is no airspace disease, pneumothorax or pleural effusion.  IMPRESSION: No acute cardiopulmonary disease.  Chronic changes of the chest.   Electronically Signed   By: Andreas Newport M.D.   On: 11/20/2013 22:30   Dg Femur Left Port  11/20/2013   CLINICAL DATA:  Femur fracture  EXAM: PORTABLE LEFT FEMUR - 2 VIEW  COMPARISON:  Prior radiograph from 06/05/2013  FINDINGS: There is an acute oblique fracture through the midshaft of the left femur with medial angulation and anterior displacement. There is foreshortening with approximately 10 cm of overlap.  A left total hip arthroplasty is grossly intact without evidence of failure or complication. Diffuse osteopenia. Prominent vascular calcifications within the leg. Severe degenerative osteoarthrosis noted at the visualized knee.  IMPRESSION: 1. Acute oblique fracture through the midshaft of the left femur with anteromedial displacement, medial angulation,  and approximately 10 cm of foreshortening/overlap.  2.   Grossly intact left total hip arthroplasty.   Electronically Signed   By: Rise Mu M.D.   On: 11/20/2013 22:37    Scheduled Meds: . aspirin EC  81 mg Oral Daily  . docusate sodium  100 mg Oral BID  . lisinopril  40 mg Oral Daily  . metoprolol  50 mg Oral BID  . PARoxetine  20 mg Oral Daily  . simvastatin  20 mg Oral q1800  . sodium chloride  3 mL Intravenous Q12H   Continuous Infusions:   Principal Problem:   Closed displaced oblique fracture of shaft of left femur Active Problems:   Aortic stenosis, severe: PER 2 D ECHO 11/21/13   HTN (hypertension)   Systolic murmur   Hypercholesterolemia   Fracture, femur, shaft    Time spent: 30 minutes    [facility]  [facility] Pager [phone]. If 7PM-7AM, please contact night-coverage at [email or web address], password [facility] 11/21/2013, 1:22 PM  LOS: 1 day

## 2013-11-21 NOTE — Progress Notes (Signed)
@  2100 Called daughter Susa Raring and is aware pt having surgery in AM. Made aware of consent needed to be sign. Daughter stated pt alert enough to sign the consent. Pt alert and oriented x4. Able to tell where she is and her situation how she fell. She was able to tell what year and month it is now.

## 2013-11-21 NOTE — Consult Note (Signed)
Reason for Consult:left femur fracture Referring Physician:  P. Conley Rolls  MD  [name] is an 79 y.o. female.  HPI: fall with left periprothetic femur fracture below left THA done by Dr. Ophelia Charter several years ago. denieds LOC  History reviewed. No pertinent past medical history.  Past Surgical History  Procedure Laterality Date  . Abdominal hysterectomy    . Total hip arthroplasty Bilateral     No family history on file.  Social History:  has no tobacco, alcohol, and drug history on file.  Allergies: No Known Allergies  Medications: I have reviewed the patient's current medications.  Results for orders placed during the hospital encounter of 11/20/13 (from the past 48 hour(s))  CBC WITH DIFFERENTIAL     Status: Abnormal   Collection Time    11/20/13 10:00 PM      Result Value Range   WBC 7.9  4.0 - 10.5 K/uL   RBC 3.37 (*) 3.87 - 5.11 MIL/uL   Hemoglobin 10.0 (*) 12.0 - 15.0 g/dL   HCT 16.1 (*) 09.6 - 04.5 %   MCV 92.9  78.0 - 100.0 fL   MCH 29.7  26.0 - 34.0 pg   MCHC 31.9  30.0 - 36.0 g/dL   RDW 40.9  81.1 - 91.4 %   Platelets 154  150 - 400 K/uL   Neutrophils Relative % 83 (*) 43 - 77 %   Neutro Abs 6.6  1.7 - 7.7 K/uL   Lymphocytes Relative 11 (*) 12 - 46 %   Lymphs Abs 0.9  0.7 - 4.0 K/uL   Monocytes Relative 5  3 - 12 %   Monocytes Absolute 0.4  0.1 - 1.0 K/uL   Eosinophils Relative 1  0 - 5 %   Eosinophils Absolute 0.0  0.0 - 0.7 K/uL   Basophils Relative 0  0 - 1 %   Basophils Absolute 0.0  0.0 - 0.1 K/uL  BASIC METABOLIC PANEL     Status: Abnormal   Collection Time    11/20/13 10:00 PM      Result Value Range   Sodium 144  135 - 145 mEq/L   Potassium 3.8  3.5 - 5.1 mEq/L   Chloride 106  96 - 112 mEq/L   CO2 28  19 - 32 mEq/L   Glucose, Bld 123 (*) 70 - 99 mg/dL   BUN 22  6 - 23 mg/dL   Creatinine, Ser 7.82  0.50 - 1.10 mg/dL   Calcium 8.9  8.4 - 95.6 mg/dL   GFR calc non Af Amer 46 (*) >90 mL/min   GFR calc Af Amer 53 (*) >90 mL/min   Comment: (NOTE)      The eGFR has been calculated using the CKD EPI equation.     This calculation has not been validated in all clinical situations.     eGFR's persistently <90 mL/min signify possible Chronic Kidney     Disease.  URINALYSIS, ROUTINE W REFLEX MICROSCOPIC     Status: Abnormal   Collection Time    11/20/13 10:00 PM      Result Value Range   Color, Urine YELLOW  YELLOW   APPearance CLEAR  CLEAR   Specific Gravity, Urine 1.020  1.005 - 1.030   pH 5.5  5.0 - 8.0   Glucose, UA NEGATIVE  NEGATIVE mg/dL   Hgb urine dipstick SMALL (*) NEGATIVE   Bilirubin Urine NEGATIVE  NEGATIVE   Ketones, ur NEGATIVE  NEGATIVE mg/dL   Protein, ur  NEGATIVE  NEGATIVE mg/dL   Urobilinogen, UA 0.2  0.0 - 1.0 mg/dL   Nitrite NEGATIVE  NEGATIVE   Leukocytes, UA NEGATIVE  NEGATIVE  URINE MICROSCOPIC-ADD ON     Status: None   Collection Time    11/20/13 10:00 PM      Result Value Range   Squamous Epithelial / LPF RARE  RARE   WBC, UA 0-2  <3 WBC/hpf   RBC / HPF 0-2  <3 RBC/hpf   Bacteria, UA RARE  RARE  TYPE AND SCREEN     Status: None   Collection Time    11/20/13 10:02 PM      Result Value Range   ABO/RH(D) O POS     Antibody Screen NEG     Sample Expiration 11/23/2013    ABO/RH     Status: None   Collection Time    11/20/13 10:02 PM      Result Value Range   ABO/RH(D) O POS    SURGICAL PCR SCREEN     Status: None   Collection Time    11/21/13  2:44 AM      Result Value Range   MRSA, PCR NEGATIVE  NEGATIVE   Staphylococcus aureus NEGATIVE  NEGATIVE   Comment:            The Xpert SA Assay (FDA     approved for NASAL specimens     in patients over 58 years of age),     is one component of     a comprehensive surveillance     program.  Test performance has     been validated by [employer] for patients greater     than or equal to 77 year old.     It is not intended     to diagnose infection nor to     guide or monitor treatment.    Dg Pelvis Portable  11/20/2013   CLINICAL DATA:   Status post fall; left hip pain.  EXAM: PORTABLE PELVIS 1-2 VIEWS  COMPARISON:  None.  FINDINGS: There is a significantly displaced oblique fracture extending across the left mid femoral shaft. This demonstrates approximately 1 shaft width anterior and likely medial displacement, with 6 cm of shortening at the fracture site. This is just distal to the distal aspect of the left femoral prosthesis. The femoral prostheses are unremarkable in appearance bilaterally. There is no definite evidence of loosening.  The pelvis is difficult to assess due to limitations in positioning and diffuse osteopenia. The visualized bowel gas pattern is grossly unremarkable.  IMPRESSION: 1. Significantly displaced oblique fracture extending across the left mid femoral shaft, with approximately 1 shaft width anterior and likely medial displacement, and 6 cm of shortening at the fracture site. This is just distal to the distal aspect of the left femoral prosthesis. The femoral prostheses are unremarkable in appearance bilaterally. 2. Diffuse osteopenia with regard to the visualized osseous structures.   Electronically Signed   By: Roanna Raider M.D.   On: 11/20/2013 21:11   Dg Chest Port 1 View  11/20/2013   CLINICAL DATA:  Preoperative radiograph.  Left femur fracture.  EXAM: PORTABLE CHEST - 1 VIEW  COMPARISON:  08/22/2013.  FINDINGS: Osteopenia. Heart size upper limits of normal for projection. Tortuous thoracic aorta. Probable ectasia of the ascending thoracic aorta. Healed proximal right humerus fracture with severe posttraumatic right glenohumeral osteoarthritis. There is no airspace disease, pneumothorax or pleural effusion.  IMPRESSION: No acute  cardiopulmonary disease.  Chronic changes of the chest.   Electronically Signed   By: Andreas Newport M.D.   On: 11/20/2013 22:30   Dg Femur Left Port  11/20/2013   CLINICAL DATA:  Femur fracture  EXAM: PORTABLE LEFT FEMUR - 2 VIEW  COMPARISON:  Prior radiograph from 06/05/2013   FINDINGS: There is an acute oblique fracture through the midshaft of the left femur with medial angulation and anterior displacement. There is foreshortening with approximately 10 cm of overlap.  A left total hip arthroplasty is grossly intact without evidence of failure or complication. Diffuse osteopenia. Prominent vascular calcifications within the leg. Severe degenerative osteoarthrosis noted at the visualized knee.  IMPRESSION: 1. Acute oblique fracture through the midshaft of the left femur with anteromedial displacement, medial angulation, and approximately 10 cm of foreshortening/overlap.  2.   Grossly intact left total hip arthroplasty.   Electronically Signed   By: Rise Mu M.D.   On: 11/20/2013 22:37    Review of Systems  Constitutional: Positive for malaise/fatigue. Negative for fever and chills.  Genitourinary: Negative for dysuria.  Musculoskeletal: Negative for myalgias.  Skin: Negative for rash.  Endo/Heme/Allergies: Does not bruise/bleed easily.  Psychiatric/Behavioral: Negative for depression.   Blood pressure 140/58, pulse 57, temperature 98 F (36.7 C), temperature source Oral, resp. rate 18, height 5\' 5"  (1.651 m), weight 72.576 kg (160 lb), SpO2 99.00%. Physical Exam  Constitutional: She appears well-developed and well-nourished.  Eyes: Pupils are equal, round, and reactive to light.  Neck: Normal range of motion.  Cardiovascular: Normal rate.   Respiratory: Effort normal.  GI: Soft.  Musculoskeletal:  Distal pulses intact. midshaftfemur deformity  Skin: Skin is warm and dry.    Assessment/Plan: Left periprothetic femur fracture below hip prothesis. Will require plate and cable lateral femur. Will post in AM 7:30 .  Discussed with patient she agrees to proceed.   Lawerance Matsuo C 11/21/2013, 9:24 AM

## 2013-11-21 NOTE — Progress Notes (Signed)
Orthopedic Tech Progress Note Patient Details:  [name] [birth year] [id number] 5 lbs Buck's traction applied to Left LE. Application tolerated well.  Musculoskeletal Traction Type of Traction: Bucks Skin Traction Traction Weight: 5 lbs    [name] 11/21/2013, 11:55 AM

## 2013-11-21 NOTE — Progress Notes (Signed)
Could not obtain consent from patient, oriented x2 (person, situation) only.  Called family, no return call so far.

## 2013-11-22 ENCOUNTER — Encounter (HOSPITAL_COMMUNITY): Payer: Medicare Other | Admitting: Certified Registered"

## 2013-11-22 ENCOUNTER — Inpatient Hospital Stay (HOSPITAL_COMMUNITY): Payer: Medicare Other

## 2013-11-22 ENCOUNTER — Encounter (HOSPITAL_COMMUNITY): Payer: Self-pay | Admitting: Pharmacy Technician

## 2013-11-22 ENCOUNTER — Inpatient Hospital Stay (HOSPITAL_COMMUNITY): Payer: Medicare Other | Admitting: Certified Registered"

## 2013-11-22 ENCOUNTER — Encounter (HOSPITAL_COMMUNITY)
Admission: EM | Disposition: A | Discharge status: Skilled Nursing Facility | Payer: Self-pay | Source: Home / Self Care | Attending: Internal Medicine

## 2013-11-22 ENCOUNTER — Encounter (HOSPITAL_COMMUNITY): Payer: Self-pay | Admitting: Certified Registered"

## 2013-11-22 DIAGNOSIS — D62 Acute posthemorrhagic anemia: Secondary | ICD-10-CM

## 2013-11-22 HISTORY — PX: ORIF FEMUR FRACTURE: SHX2119

## 2013-11-22 LAB — CBC
MCH: 29.9 pg (ref 26.0–34.0)
MCHC: 32.1 g/dL (ref 30.0–36.0)
Platelets: 120 10*3/uL — ABNORMAL LOW (ref 150–400)
RDW: 14.6 % (ref 11.5–15.5)

## 2013-11-22 LAB — BASIC METABOLIC PANEL
BUN: 14 mg/dL (ref 6–23)
Calcium: 8.4 mg/dL (ref 8.4–10.5)
Creatinine, Ser: 0.76 mg/dL (ref 0.50–1.10)
GFR calc Af Amer: 85 mL/min — ABNORMAL LOW (ref >90)
GFR calc non Af Amer: 74 mL/min — ABNORMAL LOW (ref >90)
Glucose, Bld: 111 mg/dL — ABNORMAL HIGH (ref 70–99)

## 2013-11-22 SURGERY — OPEN REDUCTION INTERNAL FIXATION (ORIF) DISTAL FEMUR FRACTURE
Anesthesia: General | Laterality: Left

## 2013-11-22 MED ORDER — DOCUSATE SODIUM 100 MG PO CAPS
100.0000 mg | ORAL_CAPSULE | Freq: Two times a day (BID) | ORAL | Status: DC
  Administered 2013-11-22 – 2013-11-27 (×11): 100 mg via ORAL
  Filled 2013-11-22 (×11): qty 1

## 2013-11-22 MED ORDER — CEFAZOLIN SODIUM-DEXTROSE 2-3 GM-% IV SOLR
INTRAVENOUS | Status: AC
  Filled 2013-11-22: qty 50

## 2013-11-22 MED ORDER — EPHEDRINE SULFATE 50 MG/ML IJ SOLN
INTRAMUSCULAR | Status: DC | PRN
  Administered 2013-11-22: 5 mg via INTRAVENOUS
  Administered 2013-11-22: 10 mg via INTRAVENOUS
  Administered 2013-11-22: 20 mg via INTRAVENOUS

## 2013-11-22 MED ORDER — NEOSTIGMINE METHYLSULFATE 1 MG/ML IJ SOLN
INTRAMUSCULAR | Status: DC | PRN
  Administered 2013-11-22: 4 mg via INTRAVENOUS

## 2013-11-22 MED ORDER — GLYCOPYRROLATE 0.2 MG/ML IJ SOLN
INTRAMUSCULAR | Status: DC | PRN
  Administered 2013-11-22: .6 mg via INTRAVENOUS

## 2013-11-22 MED ORDER — LISINOPRIL 40 MG PO TABS
40.0000 mg | ORAL_TABLET | Freq: Every day | ORAL | Status: DC
  Administered 2013-11-22 – 2013-11-26 (×4): 40 mg via ORAL
  Filled 2013-11-22 (×6): qty 1

## 2013-11-22 MED ORDER — BUPIVACAINE HCL 0.5 % IJ SOLN
INTRAMUSCULAR | Status: DC | PRN
  Administered 2013-11-22: 20 mL

## 2013-11-22 MED ORDER — METHOCARBAMOL 100 MG/ML IJ SOLN
500.0000 mg | Freq: Four times a day (QID) | INTRAVENOUS | Status: DC | PRN
  Filled 2013-11-22: qty 5

## 2013-11-22 MED ORDER — MEPERIDINE HCL 25 MG/ML IJ SOLN: 6.2500 mg | INTRAMUSCULAR | Status: DC | PRN

## 2013-11-22 MED ORDER — BUPIVACAINE HCL (PF) 0.5 % IJ SOLN
INTRAMUSCULAR | Status: AC
  Filled 2013-11-22: qty 10

## 2013-11-22 MED ORDER — METOCLOPRAMIDE HCL 10 MG PO TABS: 5.0000 mg | ORAL_TABLET | Freq: Three times a day (TID) | ORAL | Status: DC | PRN

## 2013-11-22 MED ORDER — ACETAMINOPHEN 650 MG RE SUPP: 650.0000 mg | Freq: Four times a day (QID) | RECTAL | Status: DC | PRN

## 2013-11-22 MED ORDER — CEFAZOLIN SODIUM-DEXTROSE 2-3 GM-% IV SOLR
2.0000 g | Freq: Four times a day (QID) | INTRAVENOUS | Status: AC
  Administered 2013-11-22 (×2): 2 g via INTRAVENOUS
  Filled 2013-11-22 (×2): qty 50

## 2013-11-22 MED ORDER — LACTATED RINGERS IV SOLN
INTRAVENOUS | Status: DC | PRN
  Administered 2013-11-22: 07:00:00 via INTRAVENOUS

## 2013-11-22 MED ORDER — METOCLOPRAMIDE HCL 5 MG/ML IJ SOLN: 5.0000 mg | Freq: Three times a day (TID) | INTRAMUSCULAR | Status: DC | PRN

## 2013-11-22 MED ORDER — OXYCODONE HCL 5 MG PO TABS
5.0000 mg | ORAL_TABLET | Freq: Once | ORAL | Status: AC | PRN
  Filled 2013-11-22: qty 1

## 2013-11-22 MED ORDER — FENTANYL CITRATE 0.05 MG/ML IJ SOLN
INTRAMUSCULAR | Status: DC | PRN
  Administered 2013-11-22 (×2): 50 ug via INTRAVENOUS
  Administered 2013-11-22: 100 ug via INTRAVENOUS
  Administered 2013-11-22: 50 ug via INTRAVENOUS
  Administered 2013-11-22: 100 ug via INTRAVENOUS

## 2013-11-22 MED ORDER — ASPIRIN 325 MG PO TABS
325.0000 mg | ORAL_TABLET | Freq: Every day | ORAL | Status: DC
  Filled 2013-11-22: qty 1

## 2013-11-22 MED ORDER — ONDANSETRON HCL 4 MG PO TABS: 4.0000 mg | ORAL_TABLET | Freq: Four times a day (QID) | ORAL | Status: DC | PRN

## 2013-11-22 MED ORDER — HYDROMORPHONE HCL PF 1 MG/ML IJ SOLN
0.2500 mg | INTRAMUSCULAR | Status: DC | PRN
  Administered 2013-11-22 (×2): 0.25 mg via INTRAVENOUS

## 2013-11-22 MED ORDER — ASPIRIN EC 325 MG PO TBEC
325.0000 mg | DELAYED_RELEASE_TABLET | Freq: Every day | ORAL | Status: DC
  Administered 2013-11-23 – 2013-11-27 (×5): 325 mg via ORAL
  Filled 2013-11-22 (×6): qty 1

## 2013-11-22 MED ORDER — MENTHOL 3 MG MT LOZG
1.0000 | LOZENGE | OROMUCOSAL | Status: DC | PRN
  Filled 2013-11-22: qty 9

## 2013-11-22 MED ORDER — ROCURONIUM BROMIDE 100 MG/10ML IV SOLN
INTRAVENOUS | Status: DC | PRN
  Administered 2013-11-22: 50 mg via INTRAVENOUS

## 2013-11-22 MED ORDER — HALOPERIDOL LACTATE 5 MG/ML IJ SOLN
4.0000 mg | Freq: Once | INTRAMUSCULAR | Status: DC
  Filled 2013-11-22: qty 1

## 2013-11-22 MED ORDER — ETOMIDATE 2 MG/ML IV SOLN
INTRAVENOUS | Status: DC | PRN
  Administered 2013-11-22: 14 mg via INTRAVENOUS

## 2013-11-22 MED ORDER — METHOCARBAMOL 500 MG PO TABS
500.0000 mg | ORAL_TABLET | Freq: Four times a day (QID) | ORAL | Status: DC | PRN
  Administered 2013-11-22 – 2013-11-24 (×5): 500 mg via ORAL
  Filled 2013-11-22 (×5): qty 1

## 2013-11-22 MED ORDER — HYDROCODONE-ACETAMINOPHEN 5-325 MG PO TABS
1.0000 | ORAL_TABLET | Freq: Four times a day (QID) | ORAL | Status: DC | PRN
  Administered 2013-11-22: 1 via ORAL
  Administered 2013-11-23 – 2013-11-25 (×6): 2 via ORAL
  Filled 2013-11-22 (×3): qty 2
  Filled 2013-11-22: qty 1
  Filled 2013-11-22 (×3): qty 2

## 2013-11-22 MED ORDER — SODIUM CHLORIDE 0.45 % IV SOLN
INTRAVENOUS | Status: DC
  Administered 2013-11-22: 21:00:00 via INTRAVENOUS

## 2013-11-22 MED ORDER — VECURONIUM BROMIDE 10 MG IV SOLR
INTRAVENOUS | Status: DC | PRN
  Administered 2013-11-22: 2 mg via INTRAVENOUS

## 2013-11-22 MED ORDER — HYDROCODONE-ACETAMINOPHEN 5-325 MG PO TABS: 2.0000 | ORAL_TABLET | ORAL | Status: DC | PRN

## 2013-11-22 MED ORDER — ACETAMINOPHEN 325 MG PO TABS
650.0000 mg | ORAL_TABLET | Freq: Four times a day (QID) | ORAL | Status: DC | PRN
  Administered 2013-11-24: 650 mg via ORAL
  Filled 2013-11-22 (×3): qty 2

## 2013-11-22 MED ORDER — ONDANSETRON HCL 4 MG/2ML IJ SOLN
INTRAMUSCULAR | Status: DC | PRN
  Administered 2013-11-22: 4 mg via INTRAVENOUS

## 2013-11-22 MED ORDER — LIDOCAINE HCL (CARDIAC) 20 MG/ML IV SOLN
INTRAVENOUS | Status: DC | PRN
  Administered 2013-11-22: 100 mg via INTRAVENOUS

## 2013-11-22 MED ORDER — ONDANSETRON HCL 4 MG/2ML IJ SOLN: 4.0000 mg | Freq: Once | INTRAMUSCULAR | Status: AC | PRN

## 2013-11-22 MED ORDER — 0.9 % SODIUM CHLORIDE (POUR BTL) OPTIME
TOPICAL | Status: DC | PRN
  Administered 2013-11-22: 1000 mL

## 2013-11-22 MED ORDER — OXYCODONE HCL 5 MG/5ML PO SOLN: 5.0000 mg | Freq: Once | ORAL | Status: AC | PRN

## 2013-11-22 MED ORDER — FLEET ENEMA 7-19 GM/118ML RE ENEM: 1.0000 | ENEMA | Freq: Once | RECTAL | Status: AC | PRN

## 2013-11-22 MED ORDER — POLYETHYLENE GLYCOL 3350 17 G PO PACK
17.0000 g | PACK | Freq: Every day | ORAL | Status: DC | PRN
  Administered 2013-11-27: 17 g via ORAL
  Filled 2013-11-22: qty 1

## 2013-11-22 MED ORDER — HYDROMORPHONE HCL PF 1 MG/ML IJ SOLN
INTRAMUSCULAR | Status: AC
  Filled 2013-11-22: qty 1

## 2013-11-22 MED ORDER — BISACODYL 10 MG RE SUPP: 10.0000 mg | Freq: Every day | RECTAL | Status: DC | PRN

## 2013-11-22 MED ORDER — METOPROLOL TARTRATE 100 MG PO TABS
100.0000 mg | ORAL_TABLET | Freq: Two times a day (BID) | ORAL | Status: DC
  Administered 2013-11-22 – 2013-11-26 (×8): 100 mg via ORAL
  Filled 2013-11-22 (×12): qty 1

## 2013-11-22 MED ORDER — HYDROCHLOROTHIAZIDE 25 MG PO TABS
25.0000 mg | ORAL_TABLET | Freq: Every day | ORAL | Status: DC
  Administered 2013-11-22 – 2013-11-26 (×5): 25 mg via ORAL
  Filled 2013-11-22 (×7): qty 1

## 2013-11-22 MED ORDER — CEFAZOLIN SODIUM-DEXTROSE 2-3 GM-% IV SOLR
2.0000 g | Freq: Once | INTRAVENOUS | Status: AC
  Administered 2013-11-22: 2 g via INTRAVENOUS

## 2013-11-22 MED ORDER — PHENOL 1.4 % MT LIQD: 1.0000 | OROMUCOSAL | Status: DC | PRN

## 2013-11-22 MED ORDER — MORPHINE SULFATE 2 MG/ML IJ SOLN
0.5000 mg | INTRAMUSCULAR | Status: DC | PRN
  Administered 2013-11-22 (×3): 0.5 mg via INTRAVENOUS
  Filled 2013-11-22 (×3): qty 1

## 2013-11-22 MED ORDER — ONDANSETRON HCL 4 MG/2ML IJ SOLN: 4.0000 mg | Freq: Four times a day (QID) | INTRAMUSCULAR | Status: DC | PRN

## 2013-11-22 MED ORDER — PAROXETINE HCL 20 MG PO TABS
20.0000 mg | ORAL_TABLET | Freq: Every day | ORAL | Status: DC
  Administered 2013-11-22 – 2013-11-27 (×6): 20 mg via ORAL
  Filled 2013-11-22 (×6): qty 1

## 2013-11-22 SURGICAL SUPPLY — 63 items
BIT DRILL 4.8MMDIAX5IN DISPOSE (BIT) ×1 IMPLANT
BIT DRILL Q/COUPLING 1 (BIT) ×1 IMPLANT
BLADE SURG 10 STRL SS (BLADE) ×1 IMPLANT
BNDG COHESIVE 4X5 TAN STRL (GAUZE/BANDAGES/DRESSINGS) ×1 IMPLANT
CABLE FOR BONE PLATE (Cable) ×6 IMPLANT
CLOTH BEACON ORANGE TIMEOUT ST (SAFETY) ×2 IMPLANT
CUFF TOURNIQUET SINGLE 34IN LL (TOURNIQUET CUFF) IMPLANT
CUFF TOURNIQUET SINGLE 44IN (TOURNIQUET CUFF) IMPLANT
DRAPE C-ARM 42X72 X-RAY (DRAPES) ×1 IMPLANT
DRAPE C-ARMOR (DRAPES) ×1 IMPLANT
DRAPE INCISE IOBAN 66X45 STRL (DRAPES) ×1 IMPLANT
DRAPE ORTHO SPLIT 77X108 STRL (DRAPES) ×6
DRAPE STERI IOBAN 125X83 (DRAPES) ×1 IMPLANT
DRAPE SURG 17X23 STRL (DRAPES) ×1 IMPLANT
DRAPE SURG ORHT 6 SPLT 77X108 (DRAPES) IMPLANT
DRILL BIT 4.8MMDIAX5IN DISPOSE (BIT)
DRSG ADAPTIC 3X8 NADH LF (GAUZE/BANDAGES/DRESSINGS) ×2 IMPLANT
DRSG PAD ABDOMINAL 8X10 ST (GAUZE/BANDAGES/DRESSINGS) ×2 IMPLANT
DURAPREP 26ML APPLICATOR (WOUND CARE) ×2 IMPLANT
ELECT CAUTERY BLADE 6.4 (BLADE) ×2 IMPLANT
ELECT REM PT RETURN 9FT ADLT (ELECTROSURGICAL) ×2
ELECTRODE REM PT RTRN 9FT ADLT (ELECTROSURGICAL) ×1 IMPLANT
EVACUATOR 1/8 PVC DRAIN (DRAIN) IMPLANT
GAUZE XEROFORM 5X9 LF (GAUZE/BANDAGES/DRESSINGS) ×1 IMPLANT
GLOVE BIOGEL PI IND STRL 7.5 (GLOVE) ×1 IMPLANT
GLOVE BIOGEL PI IND STRL 8 (GLOVE) ×1 IMPLANT
GLOVE BIOGEL PI INDICATOR 7.5 (GLOVE) ×3
GLOVE BIOGEL PI INDICATOR 8 (GLOVE) ×2
GLOVE ECLIPSE 7.0 STRL STRAW (GLOVE) ×4 IMPLANT
GLOVE ECLIPSE 8.5 STRL (GLOVE) ×1 IMPLANT
GLOVE ORTHO TXT STRL SZ7.5 (GLOVE) ×1 IMPLANT
GLOVE SURG 8.5 LATEX PF (GLOVE) ×1 IMPLANT
GOWN PREVENTION PLUS LG XLONG (DISPOSABLE) ×1 IMPLANT
GOWN STRL NON-REIN LRG LVL3 (GOWN DISPOSABLE) ×5 IMPLANT
GOWN STRL REIN 2XL LVL4 (GOWN DISPOSABLE) ×1 IMPLANT
KIT BASIN OR (CUSTOM PROCEDURE TRAY) ×2 IMPLANT
KIT ROOM TURNOVER OR (KITS) ×2 IMPLANT
MANIFOLD NEPTUNE II (INSTRUMENTS) ×2 IMPLANT
NS IRRIG 1000ML POUR BTL (IV SOLUTION) ×2 IMPLANT
PACK GENERAL/GYN (CUSTOM PROCEDURE TRAY) ×1 IMPLANT
PACK ORTHO EXTREMITY (CUSTOM PROCEDURE TRAY) ×1 IMPLANT
PAD ABD 8X10 STRL (GAUZE/BANDAGES/DRESSINGS) ×1 IMPLANT
PAD ARMBOARD 7.5X6 YLW CONV (MISCELLANEOUS) ×4 IMPLANT
PLATE CABLE BONE 6HOLE (Plate) ×1 IMPLANT
SCREW CORTEX ST 4.5X36 (Screw) ×1 IMPLANT
SCREW CORTEX ST 4.5X38 (Screw) ×1 IMPLANT
SCREW CORTEX ST 4.5X44 (Screw) ×2 IMPLANT
SPONGE GAUZE 4X4 12PLY (GAUZE/BANDAGES/DRESSINGS) ×2 IMPLANT
SPONGE GAUZE 4X4 12PLY STER LF (GAUZE/BANDAGES/DRESSINGS) ×1 IMPLANT
SPONGE LAP 18X18 X RAY DECT (DISPOSABLE) ×3 IMPLANT
STAPLER VISISTAT 35W (STAPLE) ×2 IMPLANT
STOCKINETTE IMPERVIOUS 9X36 MD (GAUZE/BANDAGES/DRESSINGS) ×1 IMPLANT
SUT VIC AB 0 CT1 27 (SUTURE) ×4
SUT VIC AB 0 CT1 27XBRD ANBCTR (SUTURE) ×3 IMPLANT
SUT VIC AB 1 CT1 27 (SUTURE) ×2
SUT VIC AB 1 CT1 27XBRD ANBCTR (SUTURE) ×1 IMPLANT
SUT VIC AB 2-0 CT1 27 (SUTURE) ×2
SUT VIC AB 2-0 CT1 TAPERPNT 27 (SUTURE) ×1 IMPLANT
TAPE CLOTH SURG 4X10 WHT LF (GAUZE/BANDAGES/DRESSINGS) ×1 IMPLANT
TOWEL OR 17X24 6PK STRL BLUE (TOWEL DISPOSABLE) ×2 IMPLANT
TOWEL OR 17X26 10 PK STRL BLUE (TOWEL DISPOSABLE) ×2 IMPLANT
WATER STERILE IRR 1000ML POUR (IV SOLUTION) ×2 IMPLANT
YANKAUER SUCT BULB TIP NO VENT (SUCTIONS) ×1 IMPLANT

## 2013-11-22 NOTE — Anesthesia Preprocedure Evaluation (Addendum)
Anesthesia Evaluation  Patient identified by MRN, date of birth, ID band Patient awake    Reviewed: Allergy & Precautions, H&P , NPO status , Patient's Chart, lab work & pertinent test results  Airway Mallampati: I TM Distance: >3 FB Neck ROM: Full    Dental   Pulmonary          Cardiovascular hypertension, Pt. on medications + Valvular Problems/Murmurs AS  Pt has severe AS with valve area .8. Will need Art line.   Neuro/Psych    GI/Hepatic   Endo/Other    Renal/GU      Musculoskeletal   Abdominal   Peds  Hematology   Anesthesia Other Findings   Reproductive/Obstetrics                          Anesthesia Physical Anesthesia Plan  ASA: III  Anesthesia Plan: General   Post-op Pain Management:    Induction: Intravenous  Airway Management Planned: Oral ETT  Additional Equipment:   Intra-op Plan:   Post-operative Plan: Extubation in OR  Informed Consent: I have reviewed the patients History and Physical, chart, labs and discussed the procedure including the risks, benefits and alternatives for the proposed anesthesia with the patient or authorized representative who has indicated his/her understanding and acceptance.     Plan Discussed with: CRNA and Surgeon  Anesthesia Plan Comments:         Anesthesia Quick Evaluation

## 2013-11-22 NOTE — Interval H&P Note (Signed)
History and Physical Interval Note:  11/22/2013 7:19 AM  [name]  has presented today for surgery, with the diagnosis of left femur fracture  The various methods of treatment have been discussed with the patient and family. After consideration of risks, benefits and other options for treatment, the patient has consented to  Procedure(s): OPEN REDUCTION INTERNAL FIXATION (ORIF) DISTAL FEMUR FRACTURE (Left) as a surgical intervention .  The patient's history has been reviewed, patient examined, no change in status, stable for surgery.  I have reviewed the patient's chart and labs.  Questions were answered to the patient's satisfaction.     Marissa Weaver C

## 2013-11-22 NOTE — Brief Op Note (Addendum)
11/20/2013 - 11/22/2013  10:32 AM  PATIENT:  [name]  79 y.o. female  PRE-OPERATIVE DIAGNOSIS:  left femur fracture  POST-OPERATIVE DIAGNOSIS:  left femur fracture  PROCEDURE:  Procedure(s): OPEN REDUCTION INTERNAL FIXATION (ORIF) DISTAL FEMUR FRACTURE (Left)  SURGEON:  Surgeon(s) and Role:    * Eldred Manges, MD - Primary    * Kerrin Champagne, MD - Assisting  PHYSICIAN ASSISTANT:   ASSISTANTS: Vira Browns MD   ANESTHESIA:   local and general  EBL:  Total I/O In: 1638 [I.V.:1000; Blood:638] Out: 900 [Urine:200; Blood:700]  BLOOD ADMINISTERED:2 units CC PRBC  DRAINS: (closed. on suction) Hemovact drain(s) in the thigh with  Suction Open   LOCAL MEDICATIONS USED:  MARCAINE     SPECIMEN:  No Specimen  DISPOSITION OF SPECIMEN:  N/A  COUNTS:  YES  TOURNIQUET:  * No tourniquets in log *  DICTATION: .Other Dictation: Dictation Number [id number]  PLAN OF CARE: already inpatient  PATIENT DISPOSITION:  PACU - hemodynamically stable.   Delay start of Pharmacological VTE agent (>24hrs) due to surgical blood loss or risk of bleeding:   ASA and SCD

## 2013-11-22 NOTE — Preoperative (Signed)
Beta Blockers   Reason not to administer Beta Blockers:beta blocker taken last evening

## 2013-11-22 NOTE — Care Management Note (Signed)
CARE MANAGEMENT NOTE 11/22/2013  Patient:  [name], [name]   Account Number:  [id number]  Date Initiated:  11/22/2013  Documentation initiated by:  Vance Peper  Subjective/Objective Assessment:   79 yr old female s/p ORIF left distal femur fracture 11/22/13     Action/Plan:   Waiting for PT/OT eval   Anticipated DC Date:     Anticipated DC Plan:           Choice offered to / List presented to:             Status of service:  In process, will continue to follow

## 2013-11-22 NOTE — H&P (View-Only) (Signed)
Reason for Consult:left femur fracture Referring Physician:  P. Le  MD  [name] is an 79 y.o. female.  HPI: fall with left periprothetic femur fracture below left THA done by Dr. Ladarious Kresse several years ago. denieds LOC  History reviewed. No pertinent past medical history.  Past Surgical History  Procedure Laterality Date  . Abdominal hysterectomy    . Total hip arthroplasty Bilateral     No family history on file.  Social History:  has no tobacco, alcohol, and drug history on file.  Allergies: No Known Allergies  Medications: I have reviewed the patient's current medications.  Results for orders placed during the hospital encounter of 11/20/13 (from the past 48 hour(s))  CBC WITH DIFFERENTIAL     Status: Abnormal   Collection Time    11/20/13 10:00 PM      Result Value Range   WBC 7.9  4.0 - 10.5 K/uL   RBC 3.37 (*) 3.87 - 5.11 MIL/uL   Hemoglobin 10.0 (*) 12.0 - 15.0 g/dL   HCT 31.3 (*) 36.0 - 46.0 %   MCV 92.9  78.0 - 100.0 fL   MCH 29.7  26.0 - 34.0 pg   MCHC 31.9  30.0 - 36.0 g/dL   RDW 14.6  11.5 - 15.5 %   Platelets 154  150 - 400 K/uL   Neutrophils Relative % 83 (*) 43 - 77 %   Neutro Abs 6.6  1.7 - 7.7 K/uL   Lymphocytes Relative 11 (*) 12 - 46 %   Lymphs Abs 0.9  0.7 - 4.0 K/uL   Monocytes Relative 5  3 - 12 %   Monocytes Absolute 0.4  0.1 - 1.0 K/uL   Eosinophils Relative 1  0 - 5 %   Eosinophils Absolute 0.0  0.0 - 0.7 K/uL   Basophils Relative 0  0 - 1 %   Basophils Absolute 0.0  0.0 - 0.1 K/uL  BASIC METABOLIC PANEL     Status: Abnormal   Collection Time    11/20/13 10:00 PM      Result Value Range   Sodium 144  135 - 145 mEq/L   Potassium 3.8  3.5 - 5.1 mEq/L   Chloride 106  96 - 112 mEq/L   CO2 28  19 - 32 mEq/L   Glucose, Bld 123 (*) 70 - 99 mg/dL   BUN 22  6 - 23 mg/dL   Creatinine, Ser 1.06  0.50 - 1.10 mg/dL   Calcium 8.9  8.4 - 10.5 mg/dL   GFR calc non Af Amer 46 (*) >90 mL/min   GFR calc Af Amer 53 (*) >90 mL/min   Comment: (NOTE)      The eGFR has been calculated using the CKD EPI equation.     This calculation has not been validated in all clinical situations.     eGFR's persistently <90 mL/min signify possible Chronic Kidney     Disease.  URINALYSIS, ROUTINE W REFLEX MICROSCOPIC     Status: Abnormal   Collection Time    11/20/13 10:00 PM      Result Value Range   Color, Urine YELLOW  YELLOW   APPearance CLEAR  CLEAR   Specific Gravity, Urine 1.020  1.005 - 1.030   pH 5.5  5.0 - 8.0   Glucose, UA NEGATIVE  NEGATIVE mg/dL   Hgb urine dipstick SMALL (*) NEGATIVE   Bilirubin Urine NEGATIVE  NEGATIVE   Ketones, ur NEGATIVE  NEGATIVE mg/dL   Protein, ur   NEGATIVE  NEGATIVE mg/dL   Urobilinogen, UA 0.2  0.0 - 1.0 mg/dL   Nitrite NEGATIVE  NEGATIVE   Leukocytes, UA NEGATIVE  NEGATIVE  URINE MICROSCOPIC-ADD ON     Status: None   Collection Time    11/20/13 10:00 PM      Result Value Range   Squamous Epithelial / LPF RARE  RARE   WBC, UA 0-2  <3 WBC/hpf   RBC / HPF 0-2  <3 RBC/hpf   Bacteria, UA RARE  RARE  TYPE AND SCREEN     Status: None   Collection Time    11/20/13 10:02 PM      Result Value Range   ABO/RH(D) O POS     Antibody Screen NEG     Sample Expiration 11/23/2013    ABO/RH     Status: None   Collection Time    11/20/13 10:02 PM      Result Value Range   ABO/RH(D) O POS    SURGICAL PCR SCREEN     Status: None   Collection Time    11/21/13  2:44 AM      Result Value Range   MRSA, PCR NEGATIVE  NEGATIVE   Staphylococcus aureus NEGATIVE  NEGATIVE   Comment:            The Xpert SA Assay (FDA     approved for NASAL specimens     in patients over 21 years of age),     is one component of     a comprehensive surveillance     program.  Test performance has     been validated by [facility] for patients greater     than or equal to 1 year old.     It is not intended     to diagnose infection nor to     guide or monitor treatment.    Dg Pelvis Portable  11/20/2013   CLINICAL DATA:   Status post fall; left hip pain.  EXAM: PORTABLE PELVIS 1-2 VIEWS  COMPARISON:  None.  FINDINGS: There is a significantly displaced oblique fracture extending across the left mid femoral shaft. This demonstrates approximately 1 shaft width anterior and likely medial displacement, with 6 cm of shortening at the fracture site. This is just distal to the distal aspect of the left femoral prosthesis. The femoral prostheses are unremarkable in appearance bilaterally. There is no definite evidence of loosening.  The pelvis is difficult to assess due to limitations in positioning and diffuse osteopenia. The visualized bowel gas pattern is grossly unremarkable.  IMPRESSION: 1. Significantly displaced oblique fracture extending across the left mid femoral shaft, with approximately 1 shaft width anterior and likely medial displacement, and 6 cm of shortening at the fracture site. This is just distal to the distal aspect of the left femoral prosthesis. The femoral prostheses are unremarkable in appearance bilaterally. 2. Diffuse osteopenia with regard to the visualized osseous structures.   Electronically Signed   By: Jeffery  Chang M.D.   On: 11/20/2013 21:11   Dg Chest Port 1 View  11/20/2013   CLINICAL DATA:  Preoperative radiograph.  Left femur fracture.  EXAM: PORTABLE CHEST - 1 VIEW  COMPARISON:  08/22/2013.  FINDINGS: Osteopenia. Heart size upper limits of normal for projection. Tortuous thoracic aorta. Probable ectasia of the ascending thoracic aorta. Healed proximal right humerus fracture with severe posttraumatic right glenohumeral osteoarthritis. There is no airspace disease, pneumothorax or pleural effusion.  IMPRESSION: No acute   cardiopulmonary disease.  Chronic changes of the chest.   Electronically Signed   By: Geoffrey  Lamke M.D.   On: 11/20/2013 22:30   Dg Femur Left Port  11/20/2013   CLINICAL DATA:  Femur fracture  EXAM: PORTABLE LEFT FEMUR - 2 VIEW  COMPARISON:  Prior radiograph from 06/05/2013   FINDINGS: There is an acute oblique fracture through the midshaft of the left femur with medial angulation and anterior displacement. There is foreshortening with approximately 10 cm of overlap.  A left total hip arthroplasty is grossly intact without evidence of failure or complication. Diffuse osteopenia. Prominent vascular calcifications within the leg. Severe degenerative osteoarthrosis noted at the visualized knee.  IMPRESSION: 1. Acute oblique fracture through the midshaft of the left femur with anteromedial displacement, medial angulation, and approximately 10 cm of foreshortening/overlap.  2.   Grossly intact left total hip arthroplasty.   Electronically Signed   By: Benjamin  McClintock M.D.   On: 11/20/2013 22:37    Review of Systems  Constitutional: Positive for malaise/fatigue. Negative for fever and chills.  Genitourinary: Negative for dysuria.  Musculoskeletal: Negative for myalgias.  Skin: Negative for rash.  Endo/Heme/Allergies: Does not bruise/bleed easily.  Psychiatric/Behavioral: Negative for depression.   Blood pressure 140/58, pulse 57, temperature 98 F (36.7 C), temperature source Oral, resp. rate 18, height 5' 5" (1.651 m), weight 72.576 kg (160 lb), SpO2 99.00%. Physical Exam  Constitutional: She appears well-developed and well-nourished.  Eyes: Pupils are equal, round, and reactive to light.  Neck: Normal range of motion.  Cardiovascular: Normal rate.   Respiratory: Effort normal.  GI: Soft.  Musculoskeletal:  Distal pulses intact. midshaftfemur deformity  Skin: Skin is warm and dry.    Assessment/Plan: Left periprothetic femur fracture below hip prothesis. Will require plate and cable lateral femur. Will post in AM 7:30 .  Discussed with patient she agrees to proceed.   Amaryah Mallen C 11/21/2013, 9:24 AM      

## 2013-11-22 NOTE — Transfer of Care (Signed)
Immediate Anesthesia Transfer of Care Note  Patient: [name]  Procedure(s) Performed: Procedure(s): OPEN REDUCTION INTERNAL FIXATION (ORIF) DISTAL FEMUR FRACTURE (Left)  Patient Location: PACU  Anesthesia Type:General  Level of Consciousness: awake, alert  and oriented  Airway & Oxygen Therapy: Patient Spontanous Breathing and Patient connected to face mask oxygen  Post-op Assessment: Report given to PACU RN and Post -op Vital signs reviewed and stable  Post vital signs: Reviewed and stable  Complications: No apparent anesthesia complications

## 2013-11-22 NOTE — Progress Notes (Addendum)
@   2030 Pt pulled hemovac drain out. No active bleeding noted at the surgical site. Surgical site reinforced with gauze and hypafix tape.

## 2013-11-22 NOTE — Progress Notes (Signed)
Orthopedic Tech Progress Note Patient Details:  [name] December 21, 1923 [id number] OHF applied to bed Patient ID: [name], female   DOB: 1924/04/18, 79 y.o.   MRN: [id number]   [name] 11/22/2013, 12:54 PM

## 2013-11-22 NOTE — Anesthesia Postprocedure Evaluation (Signed)
Anesthesia Post Note  Patient: [name]  Procedure(s) Performed: Procedure(s) (LRB): OPEN REDUCTION INTERNAL FIXATION (ORIF) DISTAL FEMUR FRACTURE (Left)  Anesthesia type: general  Patient location: PACU  Post pain: Pain level controlled  Post assessment: Patient's Cardiovascular Status Stable  Last Vitals:  Filed Vitals:   11/22/13 1045  BP: 202/64  Pulse: 65  Temp:   Resp: 23    Post vital signs: Reviewed and stable  Level of consciousness: sedated  Complications: No apparent anesthesia complications

## 2013-11-22 NOTE — Progress Notes (Signed)
[facility] PROGRESS NOTE  [name]:[id number] DOB: 04-12-1924 DOA: 11/20/2013 PCP: Georgianne Fick, MD  Assessment/Plan: #1left. Prosthetic femur fracture below hip prosthesis Secondary to mechanical fall. Patient has been seen by orthopedics. Patient is s/p ORIF distal left femur. Pain management. Per orthopedics.  #2 severe aortic stenosis 2-D echo consistent with severe aortic stenosis. Mean gradient and peak gradients have increased his last 2-D echo. Patient has been seen by cardiology and recommend monitoring fluid status closely. Cardiology ff and appreciate input and rxcs.  #3 hypertension Stable. Continue lisinopril and metoprolol.  #4 ABLA Patient with no overt GIB. Follow h/h. Transfusion threshhold Hgb < 8.  #5 hyperlipidemia Continue Zocor.  #6 prophylaxis SCDs for DVT prophylaxis.  Code Status: full Family Communication: updated patient no family present. Disposition Plan: SNF versus home with home health pending surgery   Consultants:  Orthopedics: Dr. Ophelia Charter  Cardiology Dr Myrtis Ser 11/21/13  Procedures:  2-D echo 11/21/2013  Chest x-ray 11/20/2013  X-ray of the left femur 11/20/2013  ORIF left distal femur fracture Dr Ophelia Charter 11/22/13  Antibiotics:  none  HPI/Subjective: Patient sedated. Postop.  Objective: Filed Vitals:   11/22/13 1126  BP: 147/83  Pulse: 65  Temp: 98.6 F (37 C)  Resp: 20    Intake/Output Summary (Last 24 hours) at 11/22/13 1220 Last data filed at 11/22/13 1126  Gross per 24 hour  Intake   1978 ml  Output   1825 ml  Net    153 ml   Filed Weights   11/20/13 1944  Weight: 72.576 kg (160 lb)    Exam:   General:  NAD. Sedated.  Cardiovascular: RRR WITH 3/6 sem  Respiratory: clea to auscultation bilaterally. No wheezes, no crackles, no rhonchi  Abdomen: soft, nontender, nondistended, positive bowel sounds.  Musculoskeletal: no clubbing cyanosis or edema.  Data Reviewed: Basic Metabolic  Panel:  Recent Labs Lab 11/20/13 2200 11/22/13 0500  NA 144 140  K 3.8 3.8  CL 106 104  CO2 28 28  GLUCOSE 123* 111*  BUN 22 14  CREATININE 1.06 0.76  CALCIUM 8.9 8.4   Liver Function Tests: No results found for this basename: AST, ALT, ALKPHOS, BILITOT, PROT, ALBUMIN,  in the last 168 hours No results found for this basename: LIPASE, AMYLASE,  in the last 168 hours No results found for this basename: AMMONIA,  in the last 168 hours CBC:  Recent Labs Lab 11/20/13 2200 11/22/13 0500  WBC 7.9 6.3  NEUTROABS 6.6  --   HGB 10.0* 8.4*  HCT 31.3* 26.2*  MCV 92.9 93.2  PLT 154 120*   Cardiac Enzymes: No results found for this basename: CKTOTAL, CKMB, CKMBINDEX, TROPONINI,  in the last 168 hours BNP (last 3 results) No results found for this basename: PROBNP,  in the last 8760 hours CBG: No results found for this basename: GLUCAP,  in the last 168 hours  Recent Results (from the past 240 hour(s))  URINE CULTURE     Status: None   Collection Time    11/20/13 10:00 PM      Result Value Range Status   Specimen Description URINE, RANDOM   Final   Special Requests NONE   Final   Culture  Setup Time     Final   Value: 11/21/2013 03:52     Performed at [facility]     Final   Value: NO GROWTH     Performed at [facility]  Final   Value: NO GROWTH     Performed at [facility]   Report Status 11/21/2013 FINAL   Final  SURGICAL PCR SCREEN     Status: None   Collection Time    11/21/13  2:44 AM      Result Value Range Status   MRSA, PCR NEGATIVE  NEGATIVE Final   Staphylococcus aureus NEGATIVE  NEGATIVE Final   Comment:            The Xpert SA Assay (FDA     approved for NASAL specimens     in patients over 87 years of age),     is one component of     a comprehensive surveillance     program.  Test performance has     been validated by [employer] for patients greater     than or equal to 40 year old.      It is not intended     to diagnose infection nor to     guide or monitor treatment.     Studies: Dg Pelvis Portable  11/20/2013   CLINICAL DATA:  Status post fall; left hip pain.  EXAM: PORTABLE PELVIS 1-2 VIEWS  COMPARISON:  None.  FINDINGS: There is a significantly displaced oblique fracture extending across the left mid femoral shaft. This demonstrates approximately 1 shaft width anterior and likely medial displacement, with 6 cm of shortening at the fracture site. This is just distal to the distal aspect of the left femoral prosthesis. The femoral prostheses are unremarkable in appearance bilaterally. There is no definite evidence of loosening.  The pelvis is difficult to assess due to limitations in positioning and diffuse osteopenia. The visualized bowel gas pattern is grossly unremarkable.  IMPRESSION: 1. Significantly displaced oblique fracture extending across the left mid femoral shaft, with approximately 1 shaft width anterior and likely medial displacement, and 6 cm of shortening at the fracture site. This is just distal to the distal aspect of the left femoral prosthesis. The femoral prostheses are unremarkable in appearance bilaterally. 2. Diffuse osteopenia with regard to the visualized osseous structures.   Electronically Signed   By: Roanna Raider M.D.   On: 11/20/2013 21:11   Dg Chest Port 1 View  11/20/2013   CLINICAL DATA:  Preoperative radiograph.  Left femur fracture.  EXAM: PORTABLE CHEST - 1 VIEW  COMPARISON:  08/22/2013.  FINDINGS: Osteopenia. Heart size upper limits of normal for projection. Tortuous thoracic aorta. Probable ectasia of the ascending thoracic aorta. Healed proximal right humerus fracture with severe posttraumatic right glenohumeral osteoarthritis. There is no airspace disease, pneumothorax or pleural effusion.  IMPRESSION: No acute cardiopulmonary disease.  Chronic changes of the chest.   Electronically Signed   By: Andreas Newport M.D.   On: 11/20/2013 22:30    Dg Femur Left Port  11/20/2013   CLINICAL DATA:  Femur fracture  EXAM: PORTABLE LEFT FEMUR - 2 VIEW  COMPARISON:  Prior radiograph from 06/05/2013  FINDINGS: There is an acute oblique fracture through the midshaft of the left femur with medial angulation and anterior displacement. There is foreshortening with approximately 10 cm of overlap.  A left total hip arthroplasty is grossly intact without evidence of failure or complication. Diffuse osteopenia. Prominent vascular calcifications within the leg. Severe degenerative osteoarthrosis noted at the visualized knee.  IMPRESSION: 1. Acute oblique fracture through the midshaft of the left femur with anteromedial displacement, medial angulation, and approximately 10 cm of foreshortening/overlap.  2.   Grossly intact left total hip arthroplasty.   Electronically Signed   By: Rise Mu M.D.   On: 11/20/2013 22:37    Scheduled Meds: . [START ON 11/23/2013] aspirin EC  325 mg Oral Q breakfast  . aspirin  325 mg Oral Daily  .  ceFAZolin (ANCEF) IV  2 g Intravenous Q6H  . docusate sodium  100 mg Oral BID  . hydrochlorothiazide  25 mg Oral Daily  . HYDROmorphone      . lisinopril  40 mg Oral Daily  . metoprolol  100 mg Oral BID  . PARoxetine  20 mg Oral Daily   Continuous Infusions: . sodium chloride      Principal Problem:   Closed displaced oblique fracture of shaft of left femur Active Problems:   Aortic stenosis, severe: PER 2 D ECHO 11/21/13   HTN (hypertension)   Systolic murmur   Hypercholesterolemia   Fracture, femur, shaft   Acute blood loss anemia    Time spent: 30 minutes    [facility]  [facility] Pager [phone]. If 7PM-7AM, please contact night-coverage at [email or web address], password [facility] 11/22/2013, 12:20 PM  LOS: 2 days

## 2013-11-23 DIAGNOSIS — I10 Essential (primary) hypertension: Secondary | ICD-10-CM

## 2013-11-23 LAB — BASIC METABOLIC PANEL
CO2: 26 mEq/L (ref 19–32)
Chloride: 100 mEq/L (ref 96–112)
GFR calc non Af Amer: 74 mL/min — ABNORMAL LOW (ref >90)
Glucose, Bld: 92 mg/dL (ref 70–99)
Potassium: 3.7 mEq/L (ref 3.5–5.1)
Sodium: 133 mEq/L — ABNORMAL LOW (ref 135–145)

## 2013-11-23 LAB — CBC
HCT: 24.4 % — ABNORMAL LOW (ref 36.0–46.0)
Hemoglobin: 8.1 g/dL — ABNORMAL LOW (ref 12.0–15.0)
MCHC: 33.2 g/dL (ref 30.0–36.0)
RBC: 2.75 MIL/uL — ABNORMAL LOW (ref 3.87–5.11)
WBC: 9.4 10*3/uL (ref 4.0–10.5)

## 2013-11-23 LAB — TYPE AND SCREEN

## 2013-11-23 NOTE — Progress Notes (Signed)
   Subjective:  Patient reports pain as mild.    Objective:   VITALS:   Filed Vitals:   11/23/13 0209 11/23/13 0520 11/23/13 0858 11/23/13 0950  BP: 141/69 114/43  105/49  Pulse: 67 61  62  Temp: 98.1 F (36.7 C) 98.5 F (36.9 C)    TempSrc: Oral Oral    Resp: 18 16 16    Height:      Weight:      SpO2: 95% 95% 96%     Neurologically intact Neurovascular intact Sensation intact distally Intact pulses distally Dorsiflexion/Plantar flexion intact Incision: dressing C/D/I and no drainage No cellulitis present Compartment soft   Lab Results  Component Value Date   WBC 9.4 11/23/2013   HGB 8.1* 11/23/2013   HCT 24.4* 11/23/2013   MCV 88.7 11/23/2013   PLT 96* 11/23/2013     Assessment/Plan: 1 Day Post-Op   Problem List Items Addressed This Visit     Cardiovascular and Mediastinum   Aortic stenosis, severe: PER 2 D ECHO 11/21/13 (Chronic)   Relevant Medications      lovastatin (MEVACOR) 40 MG tablet      metoprolol (LOPRESSOR) 100 MG tablet      hydrochlorothiazide (HYDRODIURIL) 25 MG tablet      lisinopril (PRINIVIL,ZESTRIL) 40 MG tablet      hydrochlorothiazide (HYDRODIURIL) tablet 25 mg      lisinopril (PRINIVIL,ZESTRIL) tablet 40 mg      metoprolol (LOPRESSOR) tablet 100 mg      aspirin EC tablet 325 mg   HTN (hypertension)     Musculoskeletal and Integument   *Closed displaced oblique fracture of shaft of left femur   Relevant Orders      Weight bearing as tolerated     Other   Systolic murmur   Hypercholesterolemia   Acute blood loss anemia    Other Visit Diagnoses   Closed nondisplaced transverse fracture of shaft of left femur, initial encounter    -  Primary    Preop cardiovascular exam           Advance diet Up with PT/OT DVT ppx - SCDs, ambulation, asa  WBAT left and lower extremity Pain control Discharge planning   [name] 11/23/2013, 10:21 AM [phone]

## 2013-11-23 NOTE — Progress Notes (Signed)
[facility] PROGRESS NOTE  KALIKA SMAY VFI:[id number] DOB: [birth year] DOA: 11/20/2013 PCP: Georgianne Fick, MD  Assessment/Plan: #1left. Prosthetic femur fracture below hip prosthesis Secondary to mechanical fall. Patient has been seen by orthopedics. Patient is s/p ORIF distal left femur. Pain management. Per orthopedics.  #2 severe aortic stenosis 2-D echo consistent with severe aortic stenosis. Mean gradient and peak gradients have increased his last 2-D echo. Patient has been seen by cardiology and recommend monitoring fluid status closely. Cardiology ff and appreciate input and rxcs. Will need outpatient cardiology followup.  #3 hypertension Stable. Continue lisinopril and metoprolol.  #4 ABLA Patient with no overt GIB. Follow h/h. Transfusion threshhold Hgb < 8.  #5 hyperlipidemia Continue Zocor.  #6 prophylaxis SCDs for DVT prophylaxis.  Code Status: full Family Communication: updated patient no family present. Disposition Plan: Awaiting SNF placement.   Consultants:  Orthopedics: Dr. Ophelia Charter  Cardiology Dr Myrtis Ser 11/21/13  Procedures:  2-D echo 11/21/2013  Chest x-ray 11/20/2013  X-ray of the left femur 11/20/2013  ORIF left distal femur fracture Dr Ophelia Charter 11/22/13  Antibiotics:  none  HPI/Subjective: Patient sitting in a chair with no complaints.  Objective: Filed Vitals:   11/23/13 0950  BP: 105/49  Pulse: 62  Temp:   Resp:     Intake/Output Summary (Last 24 hours) at 11/23/13 1059 Last data filed at 11/23/13 0913  Gross per 24 hour  Intake 2051.25 ml  Output   1625 ml  Net 426.25 ml   Filed Weights   11/20/13 1944  Weight: 72.576 kg (160 lb)    Exam:   General:  NAD. Sedated.  Cardiovascular: RRR WITH 3/6 sem  Respiratory: clea to auscultation bilaterally. No wheezes, no crackles, no rhonchi  Abdomen: soft, nontender, nondistended, positive bowel sounds.  Musculoskeletal: no clubbing cyanosis or edema.  Data  Reviewed: Basic Metabolic Panel:  Recent Labs Lab 11/20/13 2200 11/22/13 0500 11/23/13 0620  NA 144 140 133*  K 3.8 3.8 3.7  CL 106 104 100  CO2 28 28 26   GLUCOSE 123* 111* 92  BUN 22 14 9   CREATININE 1.06 0.76 0.76  CALCIUM 8.9 8.4 7.9*   Liver Function Tests: No results found for this basename: AST, ALT, ALKPHOS, BILITOT, PROT, ALBUMIN,  in the last 168 hours No results found for this basename: LIPASE, AMYLASE,  in the last 168 hours No results found for this basename: AMMONIA,  in the last 168 hours CBC:  Recent Labs Lab 11/20/13 2200 11/22/13 0500 11/23/13 0620  WBC 7.9 6.3 9.4  NEUTROABS 6.6  --   --   HGB 10.0* 8.4* 8.1*  HCT 31.3* 26.2* 24.4*  MCV 92.9 93.2 88.7  PLT 154 120* 96*   Cardiac Enzymes: No results found for this basename: CKTOTAL, CKMB, CKMBINDEX, TROPONINI,  in the last 168 hours BNP (last 3 results) No results found for this basename: PROBNP,  in the last 8760 hours CBG: No results found for this basename: GLUCAP,  in the last 168 hours  Recent Results (from the past 240 hour(s))  URINE CULTURE     Status: None   Collection Time    11/20/13 10:00 PM      Result Value Range Status   Specimen Description URINE, RANDOM   Final   Special Requests NONE   Final   Culture  Setup Time     Final   Value: 11/21/2013 03:52     Performed at [facility]     Final  Value: NO GROWTH     Performed at [facility]     Final   Value: NO GROWTH     Performed at [facility]   Report Status 11/21/2013 FINAL   Final  SURGICAL PCR SCREEN     Status: None   Collection Time    11/21/13  2:44 AM      Result Value Range Status   MRSA, PCR NEGATIVE  NEGATIVE Final   Staphylococcus aureus NEGATIVE  NEGATIVE Final   Comment:            The Xpert SA Assay (FDA     approved for NASAL specimens     in patients over 89 years of age),     is one component of     a comprehensive surveillance     program.  Test  performance has     been validated by [employer] for patients greater     than or equal to 28 year old.     It is not intended     to diagnose infection nor to     guide or monitor treatment.     Studies: Dg Femur Left  11/22/2013   CLINICAL DATA:  Periprosthetic fracture.  EXAM: LEFT FEMUR - 2 VIEW; DG C-ARM 1-60 MIN  COMPARISON:  11/20/2013  FINDINGS: C-arm films document open reduction internal fixation of a mid shaft femur fracture below a left total hip arthroplasty. Satisfactory position and alignment.  IMPRESSION: No adverse features.   Electronically Signed   By: Davonna Belling M.D.   On: 11/22/2013 14:04   Dg C-arm 1-60 Min  11/22/2013   CLINICAL DATA:  Periprosthetic fracture.  EXAM: LEFT FEMUR - 2 VIEW; DG C-ARM 1-60 MIN  COMPARISON:  11/20/2013  FINDINGS: C-arm films document open reduction internal fixation of a mid shaft femur fracture below a left total hip arthroplasty. Satisfactory position and alignment.  IMPRESSION: No adverse features.   Electronically Signed   By: Davonna Belling M.D.   On: 11/22/2013 14:04    Scheduled Meds: . aspirin EC  325 mg Oral Q breakfast  . docusate sodium  100 mg Oral BID  . hydrochlorothiazide  25 mg Oral Daily  . lisinopril  40 mg Oral Daily  . metoprolol  100 mg Oral BID  . PARoxetine  20 mg Oral Daily   Continuous Infusions:    Principal Problem:   Closed displaced oblique fracture of shaft of left femur Active Problems:   Aortic stenosis, severe: PER 2 D ECHO 11/21/13   HTN (hypertension)   Systolic murmur   Hypercholesterolemia   Fracture, femur, shaft   Acute blood loss anemia    Time spent: 30 minutes    [facility] MD [facility] Pager [phone]. If 7PM-7AM, please contact night-coverage at [email or web address], password [facility] 11/23/2013, 10:59 AM  LOS: 3 days

## 2013-11-23 NOTE — Progress Notes (Signed)
PROGRESS NOTE  Subjective:   79 yo with hx of AS, femur fracture, HTN, hyperlipidemia. She had surgery for her femur fx.  HR and BP are stable . Not on tele.   Objective:    Vital Signs:   Temp:  [97.4 F (36.3 C)-100.3 F (37.9 C)] 98.5 F (36.9 C) (12/27 0520) Pulse Rate:  [61-70] 61 (12/27 0520) Resp:  [16-23] 16 (12/27 0520) BP: (114-207)/(43-83) 114/43 mmHg (12/27 0520) SpO2:  [95 %-100 %] 95 % (12/27 0520)  Last BM Date: 11/20/13   24-hour weight change: Weight change:   Weight trends: Filed Weights   11/20/13 1944  Weight: 160 lb (72.576 kg)    Intake/Output:  12/26 0701 - 12/27 0700 In: 3433 [P.O.:360; I.V.:2435; Blood:638] Out: 2525 [Urine:1700; Drains:125; Blood:700]     Physical Exam: BP 114/43  Pulse 61  Temp(Src) 98.5 F (36.9 C) (Oral)  Resp 16  Ht 5\' 5"  (1.651 m)  Wt 160 lb (72.576 kg)  BMI 26.63 kg/m2  SpO2 95%  General: Vital signs reviewed and noted.   Head: Normocephalic, atraumatic.  Eyes: conjunctivae/corneas clear.  EOM's intact.   Throat: normal  Neck:  normal  Lungs:    clear  Heart:  RR, 2-3 / 6 systolic murmur  Abdomen:  Soft, non-tender, non-distended    Extremities: No edema.  S/p surgery    Neurologic: A&O X3, CN II - XII are grossly intact.   Psych: Normal     Labs: BMET:  Recent Labs  11/22/13 0500 11/23/13 0620  NA 140 133*  K 3.8 3.7  CL 104 100  CO2 28 26  GLUCOSE 111* 92  BUN 14 9  CREATININE 0.76 0.76  CALCIUM 8.4 7.9*    Liver function tests: No results found for this basename: AST, ALT, ALKPHOS, BILITOT, PROT, ALBUMIN,  in the last 72 hours No results found for this basename: LIPASE, AMYLASE,  in the last 72 hours  CBC:  Recent Labs  11/20/13 2200 11/22/13 0500  WBC 7.9 6.3  NEUTROABS 6.6  --   HGB 10.0* 8.4*  HCT 31.3* 26.2*  MCV 92.9 93.2  PLT 154 120*    Cardiac Enzymes: No results found for this basename: CKTOTAL, CKMB, TROPONINI,  in the last 72 hours  Coagulation  Studies: No results found for this basename: LABPROT, INR,  in the last 72 hours  Other: No components found with this basename: POCBNP,  No results found for this basename: DDIMER,  in the last 72 hours No results found for this basename: HGBA1C,  in the last 72 hours No results found for this basename: CHOL, HDL, LDLCALC, TRIG, CHOLHDL,  in the last 72 hours No results found for this basename: TSH, T4TOTAL, FREET3, T3FREE, THYROIDAB,  in the last 72 hours  Recent Labs  11/21/13 1120  VITAMINB12 502  FOLATE 7.9  FERRITIN 200  TIBC 208*  IRON 20*     Other results:  No on tele  Medications:    Infusions:    Scheduled Medications: . aspirin EC  325 mg Oral Q breakfast  . docusate sodium  100 mg Oral BID  . hydrochlorothiazide  25 mg Oral Daily  . lisinopril  40 mg Oral Daily  . metoprolol  100 mg Oral BID  . PARoxetine  20 mg Oral Daily    Assessment/ Plan:   Principal Problem:   Closed displaced oblique fracture of shaft of left femur Active Problems:   HTN (hypertension)  Systolic murmur   Hypercholesterolemia   Fracture, femur, shaft   Aortic stenosis, severe: PER 2 D ECHO 11/21/13   Acute blood loss anemia  1. Aortic stenosis:  Severe by echo.  Tolerated ortho surgery without problems.  Continue current meds.  She should follow up with Dr. Myrtis Ser in the office.   Will sign off.  Call for questions.  Length of Stay: 3  Vesta Mixer, Montez Hageman., MD, [facility] 11/23/2013, 8:27 AM Office [phone] Pager [phone]

## 2013-11-23 NOTE — Evaluation (Signed)
Physical Therapy Evaluation Patient Details Name: [name] MRN: [id number] DOB: 1924/03/25 Today's Date: 11/23/2013 Time: 4098-1191 PT Time Calculation (min): 22 min  PT Assessment / Plan / Recommendation History of Present Illness  [name] is an 79 y.o. female with hx of bilateral total hips, hx of heart murmur, had a mechanical fall  sustaining left distal femur fx.  Pt s/p left ORIF distal femur.   Clinical Impression  Patient is s/p ORIF left distal femur surgery due to mechanical fall resulting in functional limitations due to the deficits listed below (see PT Problem List). Pt confused on evaluation and needed extra time to complete task.  No family present to determine caregiver support. Patient will benefit from skilled PT to increase their independence and safety with mobility to allow discharge to the venue listed below.      PT Assessment  Patient needs continued PT services    Follow Up Recommendations  SNF;Supervision/Assistance - 24 hour    Equipment Recommendations  None recommended by PT    Recommendations for Other Services     Frequency Min 5X/week (may need to decrease frequency)    Precautions / Restrictions Precautions Precautions: Fall Restrictions Weight Bearing Restrictions: Yes LLE Weight Bearing: Weight bearing as tolerated   Pertinent Vitals/Pain Does not report pain however moans with mobility      Mobility  Bed Mobility Bed Mobility: Supine to Sit;Sitting - Scoot to Edge of Bed Supine to Sit: 1: +2 Total assist;With rails;HOB elevated Supine to Sit: Patient Percentage: 40% Sitting - Scoot to Edge of Bed: 1: +2 Total assist;With rail Sitting - Scoot to [place] of Bed: Patient Percentage: 30% Details for Bed Mobility Assistance: +2 (A) to elevate trunk OOB and advance hips to EOB with max cues for technique.  Pt very fearful of pain and anxious of any movement.  Transfers Transfers: Sit to Stand;Stand to Sit;Stand Pivot Transfers Sit  to Stand: 1: +2 Total assist;From bed Sit to Stand: Patient Percentage: 50% Stand to Sit: 1: +2 Total assist;To chair/3-in-1 Stand to Sit: Patient Percentage: 50% Stand Pivot Transfers: 1: +2 Total assist Stand Pivot Transfers: Patient Percentage: 30% Details for Transfer Assistance: +2 (A) to initiate transfer and slowly descend to recliner with max cues for LE placement and hand placement.  (A) to weight shift L/R and to advance LE towards recliner.  Ambulation/Gait Ambulation/Gait Assistance: Not tested (comment)    Exercises     PT Diagnosis: Difficulty walking;Generalized weakness;Acute pain  PT Problem List: Decreased strength;Decreased range of motion;Decreased activity tolerance;Decreased balance;Decreased mobility;Decreased cognition;Decreased knowledge of use of DME;Decreased safety awareness;Decreased knowledge of precautions;Pain PT Treatment Interventions: DME instruction;Gait training;Stair training;Functional mobility training;Therapeutic activities;Therapeutic exercise;Balance training;Cognitive remediation;Patient/family education     PT Goals(Current goals can be found in the care plan section) Acute Rehab PT Goals Patient Stated Goal: To go home PT Goal Formulation: With patient Time For Goal Achievement: 12/07/13 Potential to Achieve Goals: Good  Visit Information  Last PT Received On: 11/23/13 Assistance Needed: +2 History of Present Illness: [name] is an 79 y.o. female with hx of bilateral total hips, hx of heart murmur, had a mechanical fall  sustaining left distal femur fx.  Pt s/p left ORIF distal femur.        Prior Functioning  Home Living Family/patient expects to be discharged to:: Skilled nursing facility Living Arrangements: Alone Available Help at Discharge: Other (Comment) (unsure with no family present on evaluation) Type of Home: House Home Access: Stairs to  enter Entrance Stairs-Number of Steps: 3 Entrance Stairs-Rails: Right Home  Layout: One level Home Equipment: Walker - 2 wheels;Cane - single point Prior Function Level of Independence: Independent with assistive device(s) Comments: Pt reports completing all ADLs independently, driving and ambulating with walking stick.  Communication Communication: No difficulties Dominant Hand: Right    Cognition  Cognition Arousal/Alertness: Awake/alert Behavior During Therapy: Anxious Overall Cognitive Status: Impaired/Different from baseline Area of Impairment: Orientation;Memory;Following commands;Safety/judgement;Awareness;Problem solving Orientation Level: Disoriented to;Time Memory: Decreased short-term memory Following Commands: Follows multi-step commands inconsistently Problem Solving: Slow processing;Difficulty sequencing General Comments: Pt anxious with mobility and needs step by step instructions due to fearful of pain.     Extremity/Trunk Assessment Lower Extremity Assessment Lower Extremity Assessment: RLE deficits/detail;LLE deficits/detail RLE Deficits / Details: 4/5 gross LLE: Unable to fully assess due to pain   Balance Balance Balance Assessed: Yes Static Sitting Balance Static Sitting - Balance Support: Feet supported;Bilateral upper extremity supported Static Sitting - Level of Assistance: 3: Mod assist;4: Min assist Static Sitting - Comment/# of Minutes: initial mod (A) with posterior lean pt able to progress to intermittent min (A) due to posterior lean   End of Session PT - End of Session Equipment Utilized During Treatment: Gait belt Activity Tolerance: Patient limited by pain Patient left: in chair;with call bell/phone within reach Nurse Communication: Mobility status;Weight bearing status  GP     Kaytlen Lightsey 11/23/2013, 9:47 AM  Jake Shark, PT DPT [phone]

## 2013-11-24 LAB — BASIC METABOLIC PANEL
BUN: 16 mg/dL (ref 6–23)
CO2: 27 mEq/L (ref 19–32)
Calcium: 7.8 mg/dL — ABNORMAL LOW (ref 8.4–10.5)
Chloride: 99 mEq/L (ref 96–112)
Creatinine, Ser: 0.94 mg/dL (ref 0.50–1.10)
GFR calc Af Amer: 61 mL/min — ABNORMAL LOW (ref >90)
GFR calc non Af Amer: 53 mL/min — ABNORMAL LOW (ref >90)
Sodium: 133 mEq/L — ABNORMAL LOW (ref 135–145)

## 2013-11-24 LAB — CBC
Hemoglobin: 7.2 g/dL — ABNORMAL LOW (ref 12.0–15.0)
MCH: 30.3 pg (ref 26.0–34.0)
MCV: 88.2 fL (ref 78.0–100.0)
Platelets: 96 10*3/uL — ABNORMAL LOW (ref 150–400)
RBC: 2.38 MIL/uL — ABNORMAL LOW (ref 3.87–5.11)
RDW: 15 % (ref 11.5–15.5)
WBC: 7.8 10*3/uL (ref 4.0–10.5)

## 2013-11-24 LAB — PREPARE RBC (CROSSMATCH)

## 2013-11-24 MED ORDER — CALCIUM CARBONATE ANTACID 500 MG PO CHEW
1.0000 | CHEWABLE_TABLET | Freq: Two times a day (BID) | ORAL | Status: DC | PRN
  Administered 2013-11-24 – 2013-11-25 (×2): 200 mg via ORAL
  Filled 2013-11-24 (×3): qty 1

## 2013-11-24 MED ORDER — POTASSIUM CHLORIDE CRYS ER 20 MEQ PO TBCR
40.0000 meq | EXTENDED_RELEASE_TABLET | ORAL | Status: AC
  Administered 2013-11-24 (×2): 40 meq via ORAL
  Filled 2013-11-24 (×2): qty 2

## 2013-11-24 MED ORDER — FUROSEMIDE 10 MG/ML IJ SOLN
20.0000 mg | Freq: Once | INTRAMUSCULAR | Status: AC
  Administered 2013-11-24: 20 mg via INTRAVENOUS
  Filled 2013-11-24: qty 2

## 2013-11-24 NOTE — Progress Notes (Signed)
Dr. Suanne Marker aware of pt's Hgb being 7.2 and BP 82/49, currently BP rechecked 95/47.  No order for IV fluids or blood transfusion at this time.  Instructed to recheck BP in 1-2 hrs and if systolic <100 notify.

## 2013-11-24 NOTE — Progress Notes (Signed)
Clinical Child psychotherapist (CSW) gave list of skilled nursing facilities in [place] to patient. Patient reported that she has been to [facility] before and does not want to go back. CSW encouraged patient to speak with her daughter Susa Raring about choosing another skilled nursing facility. Patient verbalized her understanding.   Jetta Lout, LCSWA Weekend CSW [phone]

## 2013-11-24 NOTE — Progress Notes (Signed)
Clinical Social Work Department BRIEF PSYCHOSOCIAL ASSESSMENT 11/24/2013  Patient:  [name], [name]     Account Number:  [id number]     Admit date:  11/20/2013  Clinical Social Worker:  Hendricks Milo  Date/Time:  11/24/2013 05:06 PM  Referred by:  Physician  Date Referred:  11/22/2013 Referred for  SNF Placement   Other Referral:   Interview type:  Family Other interview type:    PSYCHOSOCIAL DATA Living Status:  ALONE Admitted from facility:   Level of care:   Primary support name:  [name] [phone] Primary support relationship to patient:  CHILD, ADULT Degree of support available:   Very supportive.    CURRENT CONCERNS  Other Concerns:    SOCIAL WORK ASSESSMENT / PLAN Clinical Social Worker (CSW) contacted patient's daughter [name] because per chart and RN patient is not alert and oriented. Daughter reported that patient has been to [facility] before and would like for patient to return there for rehab. Daughter reported that [facility] is close to the patient's home. Daughter agreeable to SNF search in [place].   Assessment/plan status:  Psychosocial Support/Ongoing Assessment of Needs Other assessment/ plan:   Information/referral to community resources:    PATIENT'S/FAMILY'S RESPONSE TO PLAN OF CARE: Daughter thanked CSW for calling and starting the SNF placement process.

## 2013-11-24 NOTE — Progress Notes (Signed)
[facility] PROGRESS NOTE  [name] ZOX:[id number] DOB: 06/25/1924 DOA: 11/20/2013 PCP: Georgianne Fick, MD  Assessment/Plan: #1left. Prosthetic femur fracture below hip prosthesis Secondary to mechanical fall. Patient has been seen by orthopedics. Patient is -s/p ORIF distal left femur. Per orthopedics. -Pain management.  #2 severe aortic stenosis 2-D echo consistent with severe aortic stenosis. Mean gradient and peak gradients have increased his last 2-D echo. Patient has been seen by cardiology and recommend monitoring fluid status closely. -Cardiology recommending outpatient cardiology followup.  #3 hypotension hold lisinopril and metoprolol, follow recheck and further treat accordingly.  #4 ABLA Patient with no overt GIB. Follow h/h. Transfusion threshhold Hgb < 8. -hgb 7.2 today so will transfuse 1 unit and follow  #5 hyperlipidemia Continue Zocor.  #6 prophylaxis SCDs for DVT prophylaxis.  Code Status: full Family Communication: updated patient no family present. Disposition Plan: Awaiting SNF placement.   Consultants:  Orthopedics: Dr. Ophelia Charter  Cardiology Dr Myrtis Ser 11/21/13  Procedures:  2-D echo 11/21/2013  Chest x-ray 11/20/2013  X-ray of the left femur 11/20/2013  ORIF left distal femur fracture Dr Ophelia Charter 11/22/13  Antibiotics:  none  HPI/Subjective: Patient sitting in a chair, denies chest pain and no shortness of breath. No gross bleeding.  Objective: Filed Vitals:   11/24/13 1202  BP: 113/65  Pulse: 72  Temp: 98.6 F (37 C)  Resp: 16    Intake/Output Summary (Last 24 hours) at 11/24/13 1305 Last data filed at 11/24/13 1206  Gross per 24 hour  Intake   1440 ml  Output   1600 ml  Net   -160 ml   Filed Weights   11/20/13 1944  Weight: 72.576 kg (160 lb)    Exam:   General:  NAD. Sedated.  Cardiovascular: RRR WITH 3/6 sem  Respiratory: clea to auscultation bilaterally. No wheezes, no crackles, no  rhonchi  Abdomen: soft, nontender, nondistended, positive bowel sounds.  Musculoskeletal: no clubbing cyanosis or edema.  Data Reviewed: Basic Metabolic Panel:  Recent Labs Lab 11/20/13 2200 11/22/13 0500 11/23/13 0620 11/24/13 0755  NA 144 140 133* 133*  K 3.8 3.8 3.7 3.2*  CL 106 104 100 99  CO2 28 28 26 27   GLUCOSE 123* 111* 92 111*  BUN 22 14 9 16   CREATININE 1.06 0.76 0.76 0.94  CALCIUM 8.9 8.4 7.9* 7.8*   Liver Function Tests: No results found for this basename: AST, ALT, ALKPHOS, BILITOT, PROT, ALBUMIN,  in the last 168 hours No results found for this basename: LIPASE, AMYLASE,  in the last 168 hours No results found for this basename: AMMONIA,  in the last 168 hours CBC:  Recent Labs Lab 11/20/13 2200 11/22/13 0500 11/23/13 0620 11/24/13 0755  WBC 7.9 6.3 9.4 7.8  NEUTROABS 6.6  --   --   --   HGB 10.0* 8.4* 8.1* 7.2*  HCT 31.3* 26.2* 24.4* 21.0*  MCV 92.9 93.2 88.7 88.2  PLT 154 120* 96* 96*   Cardiac Enzymes: No results found for this basename: CKTOTAL, CKMB, CKMBINDEX, TROPONINI,  in the last 168 hours BNP (last 3 results) No results found for this basename: PROBNP,  in the last 8760 hours CBG: No results found for this basename: GLUCAP,  in the last 168 hours  Recent Results (from the past 240 hour(s))  URINE CULTURE     Status: None   Collection Time    11/20/13 10:00 PM      Result Value Range Status   Specimen Description URINE, RANDOM   Final  Special Requests NONE   Final   Culture  Setup Time     Final   Value: 11/21/2013 03:52     Performed at [facility] Count     Final   Value: NO GROWTH     Performed at [facility]   Culture     Final   Value: NO GROWTH     Performed at [facility]   Report Status 11/21/2013 FINAL   Final  SURGICAL PCR SCREEN     Status: None   Collection Time    11/21/13  2:44 AM      Result Value Range Status   MRSA, PCR NEGATIVE  NEGATIVE Final   Staphylococcus aureus  NEGATIVE  NEGATIVE Final   Comment:            The Xpert SA Assay (FDA     approved for NASAL specimens     in patients over 70 years of age),     is one component of     a comprehensive surveillance     program.  Test performance has     been validated by [employer] for patients greater     than or equal to 74 year old.     It is not intended     to diagnose infection nor to     guide or monitor treatment.     Studies: No results found.  Scheduled Meds: . aspirin EC  325 mg Oral Q breakfast  . docusate sodium  100 mg Oral BID  . hydrochlorothiazide  25 mg Oral Daily  . lisinopril  40 mg Oral Daily  . metoprolol  100 mg Oral BID  . PARoxetine  20 mg Oral Daily  . potassium chloride  40 mEq Oral Q4H   Continuous Infusions:    Principal Problem:   Closed displaced oblique fracture of shaft of left femur Active Problems:   HTN (hypertension)   Systolic murmur   Hypercholesterolemia   Fracture, femur, shaft   Aortic stenosis, severe: PER 2 D ECHO 11/21/13   Acute blood loss anemia    Time spent: 35 minutes    Kela Millin MD [facility] Pager [phone]. If 7PM-7AM, please contact night-coverage at [email or web address], password [facility] 11/24/2013, 1:05 PM  LOS: 4 days

## 2013-11-24 NOTE — Progress Notes (Signed)
   Subjective:  Patient reports pain as mild.    Objective:   VITALS:   Filed Vitals:   11/24/13 0000 11/24/13 0400 11/24/13 0405 11/24/13 0748  BP:   141/53   Pulse:   69   Temp:   97.8 F (36.6 C)   TempSrc:   Oral   Resp: 16 16 16 16   Height:      Weight:      SpO2: 99% 100% 100% 100%    Dressing moderate drainage Exam stable   Lab Results  Component Value Date   WBC 7.8 11/24/2013   HGB 7.2* 11/24/2013   HCT 21.0* 11/24/2013   MCV 88.2 11/24/2013   PLT 96* 11/24/2013     Assessment/Plan: 2 Days Post-Op   Problem List Items Addressed This Visit     Cardiovascular and Mediastinum   Aortic stenosis, severe: PER 2 D ECHO 11/21/13 (Chronic)   Relevant Medications      lovastatin (MEVACOR) 40 MG tablet      metoprolol (LOPRESSOR) 100 MG tablet      hydrochlorothiazide (HYDRODIURIL) 25 MG tablet      lisinopril (PRINIVIL,ZESTRIL) 40 MG tablet      hydrochlorothiazide (HYDRODIURIL) tablet 25 mg      lisinopril (PRINIVIL,ZESTRIL) tablet 40 mg      metoprolol (LOPRESSOR) tablet 100 mg      aspirin EC tablet 325 mg   HTN (hypertension)     Musculoskeletal and Integument   *Closed displaced oblique fracture of shaft of left femur   Relevant Orders      Weight bearing as tolerated     Other   Systolic murmur   Hypercholesterolemia   Acute blood loss anemia    Other Visit Diagnoses   Closed nondisplaced transverse fracture of shaft of left femur, initial encounter    -  Primary    Preop cardiovascular exam           Advance diet Up with PT/OT DVT ppx - SCDs, ambulation, asa  WBAT left and lower extremity Pain control Dressing changed   Cheral Almas 11/24/2013, 8:44 AM [phone]

## 2013-11-24 NOTE — Progress Notes (Addendum)
Clinical Social Work Department CLINICAL SOCIAL WORK PLACEMENT NOTE 11/24/2013  Patient:  ZAMARA, COZAD  Account Number:  [id number] Admit date:  11/20/2013  Clinical Social Worker:  Jetta Lout, Theresia Majors  Date/time:  11/24/2013 05:12 PM  Clinical Social Work is seeking post-discharge placement for this patient at the following level of care:   SKILLED NURSING   (*CSW will update this form in Epic as items are completed)   11/24/2013  Patient/family provided with [facility] Department of Clinical Social Work's list of facilities offering this level of care within the geographic area requested by the patient (or if unable, by the patient's family).  11/24/2013  Patient/family informed of their freedom to choose among providers that offer the needed level of care, that participate in Medicare, Medicaid or managed care program needed by the patient, have an available bed and are willing to accept the patient.  11/24/2013  Patient/family informed of [facility]' ownership interest in [facility], as well as of the fact that they are under no obligation to receive care at this facility.  PASARR submitted to EDS on 01/29/2009 PASARR number received from [employer] on 01/29/2009  FL2 transmitted to all facilities in geographic area requested by pt/family on  11/24/2013 FL2 transmitted to all facilities within larger geographic area on   Patient informed that his/her managed care company has contracts with or will negotiate with  certain facilities, including the following:     Patient/family informed of bed offers received:   Patient chooses bed at [facility] Physician recommends and patient chooses bed at    Patient to be transferred to  on  11/27/14 Patient to be transferred to facility by [facility]  The following physician request were entered in Epic:   Additional Comments:

## 2013-11-24 NOTE — Progress Notes (Signed)
Pt unable to void. Attempted twice at bedside commode and on bedpan. In and Out catheterization performed with 600 cc of clear yellow urine drained. Pt noted to have a white, malodorous vaginal discharge.

## 2013-11-25 ENCOUNTER — Encounter (HOSPITAL_COMMUNITY): Payer: Self-pay | Admitting: General Practice

## 2013-11-25 LAB — TYPE AND SCREEN

## 2013-11-25 LAB — CBC
MCH: 29.5 pg (ref 26.0–34.0)
Platelets: 137 10*3/uL — ABNORMAL LOW (ref 150–400)
RBC: 3.02 MIL/uL — ABNORMAL LOW (ref 3.87–5.11)
WBC: 8.1 10*3/uL (ref 4.0–10.5)

## 2013-11-25 LAB — BASIC METABOLIC PANEL
CO2: 31 mEq/L (ref 19–32)
Calcium: 8.6 mg/dL (ref 8.4–10.5)
Chloride: 99 mEq/L (ref 96–112)
GFR calc non Af Amer: 73 mL/min — ABNORMAL LOW (ref >90)
Sodium: 135 mEq/L (ref 135–145)

## 2013-11-25 MED ORDER — ASPIRIN 325 MG PO TBEC: 325.0000 mg | DELAYED_RELEASE_TABLET | Freq: Every day | ORAL | Status: DC

## 2013-11-25 MED ORDER — FUROSEMIDE 10 MG/ML IJ SOLN
20.0000 mg | Freq: Once | INTRAMUSCULAR | Status: AC
  Administered 2013-11-25: 20 mg via INTRAVENOUS
  Filled 2013-11-25: qty 2

## 2013-11-25 NOTE — Progress Notes (Addendum)
[facility] PROGRESS NOTE  [name] QMV:[id number] DOB: 01-22-1924 DOA: 11/20/2013 PCP: Georgianne Fick, MD  Assessment/Plan: #1left. Prosthetic femur fracture below hip prosthesis Secondary to mechanical fall. Patient has been seen by orthopedics. Patient is -s/p ORIF distal left femur. Per orthopedics. -Pain management.  -awaiting SNF, possible bed availability in am per SW #2 severe aortic stenosis 2-D echo consistent with severe aortic stenosis. Mean gradient and peak gradients have increased his last 2-D echo. Patient has been seen by cardiology and recommend monitoring fluid status closely. -Cardiology recommending outpatient cardiology followup.  #3 hypotension BP stable today, follow and resume lisinopril and metoprolol in am if remains stable.  #4 ABLA Patient with no overt GIB. Follow h/h. Transfusion threshhold Hgb < 8. -hgb 8.9  today s/p transfusion of1 unit PRBC on 12/28  #5 hyperlipidemia Continue Zocor.  #6 prophylaxis SCDs for DVT prophylaxis.  Code Status: full Family Communication: updated patient no family present. Disposition Plan: Awaiting SNF placement.   Consultants:  Orthopedics: Dr. Ophelia Charter  Cardiology Dr Myrtis Ser 11/21/13  Procedures:  2-D echo 11/21/2013  Chest x-ray 11/20/2013  X-ray of the left femur 11/20/2013  ORIF left distal femur fracture Dr Ophelia Charter 11/22/13  Antibiotics:  none  HPI/Subjective: Patient states doing better today, denies CP and no SOB  Objective: Filed Vitals:   11/25/13 1600  BP:   Pulse:   Temp:   Resp: 18    Intake/Output Summary (Last 24 hours) at 11/25/13 1700 Last data filed at 11/25/13 1400  Gross per 24 hour  Intake 1082.5 ml  Output      0 ml  Net 1082.5 ml   Filed Weights   11/20/13 1944  Weight: 72.576 kg (160 lb)    Exam:   General:  NAD. Alert and answers appropriate  Cardiovascular: RRR WITH 3/6 sem  Respiratory: clear to auscultation bilaterally. No wheezes, no  crackles, no rhonchi  Abdomen: soft, nontender, nondistended, positive bowel sounds.  Musculoskeletal: no clubbing cyanosis or edema.  Data Reviewed: Basic Metabolic Panel:  Recent Labs Lab 11/20/13 2200 11/22/13 0500 11/23/13 0620 11/24/13 0755 11/25/13 0425  NA 144 140 133* 133* 135  K 3.8 3.8 3.7 3.2* 3.7  CL 106 104 100 99 99  CO2 28 28 26 27 31   GLUCOSE 123* 111* 92 111* 102*  BUN 22 14 9 16 18   CREATININE 1.06 0.76 0.76 0.94 0.79  CALCIUM 8.9 8.4 7.9* 7.8* 8.6   Liver Function Tests: No results found for this basename: AST, ALT, ALKPHOS, BILITOT, PROT, ALBUMIN,  in the last 168 hours No results found for this basename: LIPASE, AMYLASE,  in the last 168 hours No results found for this basename: AMMONIA,  in the last 168 hours CBC:  Recent Labs Lab 11/20/13 2200 11/22/13 0500 11/23/13 0620 11/24/13 0755 11/25/13 0425  WBC 7.9 6.3 9.4 7.8 8.1  NEUTROABS 6.6  --   --   --   --   HGB 10.0* 8.4* 8.1* 7.2* 8.9*  HCT 31.3* 26.2* 24.4* 21.0* 26.4*  MCV 92.9 93.2 88.7 88.2 87.4  PLT 154 120* 96* 96* 137*   Cardiac Enzymes: No results found for this basename: CKTOTAL, CKMB, CKMBINDEX, TROPONINI,  in the last 168 hours BNP (last 3 results) No results found for this basename: PROBNP,  in the last 8760 hours CBG: No results found for this basename: GLUCAP,  in the last 168 hours  Recent Results (from the past 240 hour(s))  URINE CULTURE     Status: None  Collection Time    11/20/13 10:00 PM      Result Value Range Status   Specimen Description URINE, RANDOM   Final   Special Requests NONE   Final   Culture  Setup Time     Final   Value: 11/21/2013 03:52     Performed at [facility] Count     Final   Value: NO GROWTH     Performed at [facility]   Culture     Final   Value: NO GROWTH     Performed at [facility]   Report Status 11/21/2013 FINAL   Final  SURGICAL PCR SCREEN     Status: None   Collection Time    11/21/13   2:44 AM      Result Value Range Status   MRSA, PCR NEGATIVE  NEGATIVE Final   Staphylococcus aureus NEGATIVE  NEGATIVE Final   Comment:            The Xpert SA Assay (FDA     approved for NASAL specimens     in patients over 97 years of age),     is one component of     a comprehensive surveillance     program.  Test performance has     been validated by [employer] for patients greater     than or equal to 31 year old.     It is not intended     to diagnose infection nor to     guide or monitor treatment.     Studies: No results found.  Scheduled Meds: . aspirin EC  325 mg Oral Q breakfast  . docusate sodium  100 mg Oral BID  . hydrochlorothiazide  25 mg Oral Daily  . lisinopril  40 mg Oral Daily  . metoprolol  100 mg Oral BID  . PARoxetine  20 mg Oral Daily   Continuous Infusions:    Principal Problem:   Closed displaced oblique fracture of shaft of left femur Active Problems:   HTN (hypertension)   Systolic murmur   Hypercholesterolemia   Fracture, femur, shaft   Aortic stenosis, severe: PER 2 D ECHO 11/21/13   Acute blood loss anemia    Time spent: 35 minutes    Kela Millin MD [facility] Pager [phone]. If 7PM-7AM, please contact night-coverage at [email or web address], password [facility] 11/25/2013, 5:00 PM  LOS: 5 days

## 2013-11-25 NOTE — Progress Notes (Signed)
OT Cancellation Note  Patient Details Name: [name] MRN: [id number] DOB: 04-Dec-1923   Cancelled Treatment:    Reason Eval/Treat Not Completed: Other (comment) defer OT eval to next venue.  Lennox Laity [phone] 11/25/2013, 10:32 AM

## 2013-11-25 NOTE — Progress Notes (Signed)
Physical Therapy Treatment Patient Details Name: [name] MRN: [id number] DOB: [birth year] Today's Date: 11/25/2013 Time: 4098-1191 PT Time Calculation (min): 17 min  PT Assessment / Plan / Recommendation  History of Present Illness [name] is an 79 y.o. female with hx of bilateral total hips, hx of heart murmur, had a mechanical fall  sustaining left distal femur fx.  Pt s/p left ORIF distal femur.    PT Comments   Seemingly better able to follow commands related to mobility, though still unaware of deficits Slow progress with transfers   Follow Up Recommendations  SNF;Supervision/Assistance - 24 hour     Does the patient have the potential to tolerate intense rehabilitation     Barriers to Discharge        Equipment Recommendations  None recommended by PT    Recommendations for Other Services    Frequency Min 3X/week   Progress towards PT Goals Progress towards PT goals: Progressing toward goals (quite slowly)  Plan Current plan remains appropriate;Frequency needs to be updated    Precautions / Restrictions Precautions Precautions: Fall Restrictions LLE Weight Bearing: Weight bearing as tolerated   Pertinent Vitals/Pain Pain L hip with getting up , did not rate, but in sitting had a significantly antalgic lean patient repositioned for comfort     Mobility  Bed Mobility Bed Mobility: Supine to Sit;Sitting - Scoot to Edge of Bed Supine to Sit: 1: +2 Total assist;With rails;HOB elevated Supine to Sit: Patient Percentage: 40% Sitting - Scoot to Edge of Bed: 1: +2 Total assist;With rail Sitting - Scoot to [place] of Bed: Patient Percentage: 40% Details for Bed Mobility Assistance: +2 (A) to elevate trunk OOB and advance hips to EOB with max cues for technique.  Transfers Transfers: Sit to Stand;Stand to Sit;Stand Pivot Transfers Sit to Stand: 1: +2 Total assist;From bed Sit to Stand: Patient Percentage: 50% Stand to Sit: 1: +2 Total assist;To  chair/3-in-1 Stand to Sit: Patient Percentage: 50% Stand Pivot Transfers: 1: +2 Total assist Stand Pivot Transfers: Patient Percentage: 40% Details for Transfer Assistance: Cues for technique; pt with difficulty accepting weight on L LE secondary to pain; briefly acheived fully upright posture, but then needed to sit; then basic pivotted to chair towards stronger L side    Exercises     PT Diagnosis:    PT Problem List:   PT Treatment Interventions:     PT Goals (current goals can now be found in the care plan section) Acute Rehab PT Goals Patient Stated Goal: To go home PT Goal Formulation: With patient Time For Goal Achievement: 12/07/13 Potential to Achieve Goals: Good  Visit Information  Last PT Received On: 11/25/13 Assistance Needed: +2 History of Present Illness: [name] is an 78 y.o. female with hx of bilateral total hips, hx of heart murmur, had a mechanical fall  sustaining left distal femur fx.  Pt s/p left ORIF distal femur.     Subjective Data  Subjective: Wanting OOB Patient Stated Goal: To go home   Cognition  Cognition Arousal/Alertness: Awake/alert Behavior During Therapy: WFL for tasks assessed/performed Overall Cognitive Status: Impaired/Different from baseline Area of Impairment: Safety/judgement Safety/Judgement: Decreased awareness of safety;Decreased awareness of deficits General Comments: Pt stated she'd still like to dc straight home, even after requiring +2 assist for safe transfer OOB; Reinforced rec of SNF for rehab to maximize safety prior to dc home    Balance  Static Sitting Balance Static Sitting - Balance Support: Feet supported;Bilateral upper extremity  supported Static Sitting - Level of Assistance: 3: Mod assist;4: Min assist Static Sitting - Comment/# of Minutes: initial mod (A) with posterior lean pt able to progress to intermittent min (A) due to antalgic L and posterior lean   End of Session PT - End of Session Equipment  Utilized During Treatment: Gait belt Activity Tolerance: Patient limited by pain Patient left: in chair;with call bell/phone within reach Nurse Communication: Mobility status;Weight bearing status   GP     Olen Pel [place], [place] [phone]  11/25/2013, 10:35 AM

## 2013-11-25 NOTE — Progress Notes (Signed)
Utilization review completed.  

## 2013-11-25 NOTE — Care Management Note (Signed)
CARE MANAGEMENT NOTE 11/25/2013  Patient:  [name], [name]   Account Number:  [id number]  Date Initiated:  11/22/2013  Documentation initiated by:  Vance Peper  Subjective/Objective Assessment:   79 yr old female s/p ORIF left distal femur fracture 11/22/13     Action/Plan:   PT/OT eval  Patient is for shortterm SNF. Social worker is aware.   Anticipated DC Date:  11/26/2013   Anticipated DC Plan:  SKILLED NURSING FACILITY  In-house referral  Clinical Social Worker      DC Planning Services  CM consult      Choice offered to / List presented to:             Status of service:  Completed, signed off Discharge Disposition:  SKILLED NURSING FACILITY

## 2013-11-25 NOTE — Progress Notes (Signed)
Subjective: 3 Days Post-Op Procedure(s) (LRB): OPEN REDUCTION INTERNAL FIXATION (ORIF) DISTAL FEMUR FRACTURE (Left) Patient reports pain as mild.  Slow with increasing activity. Doesn't want to go to [facility]  I assured her she would not be forced to go there.  Other options being researched by family/friend  Objective: Vital signs in last 24 hours: Temp:  [98.1 F (36.7 C)-99.6 F (37.6 C)] 99.1 F (37.3 C) (12/29 0605) Pulse Rate:  [72-89] 72 (12/29 0605) Resp:  [16-20] 18 (12/29 0800) BP: (98-127)/(49-65) 111/50 mmHg (12/29 0605) SpO2:  [98 %-100 %] 100 % (12/29 0605)  Intake/Output from previous day: 12/28 0701 - 12/29 0700 In: 1712.5 [P.O.:960; I.V.:290; Blood:462.5] Out: 300 [Urine:300] Intake/Output this shift:     Recent Labs  11/23/13 0620 11/24/13 0755 11/25/13 0425  HGB 8.1* 7.2* 8.9*    Recent Labs  11/24/13 0755 11/25/13 0425  WBC 7.8 8.1  RBC 2.38* 3.02*  HCT 21.0* 26.4*  PLT 96* 137*    Recent Labs  11/24/13 0755 11/25/13 0425  NA 133* 135  K 3.2* 3.7  CL 99 99  CO2 27 31  BUN 16 18  CREATININE 0.94 0.79  GLUCOSE 111* 102*  CALCIUM 7.8* 8.6   No results found for this basename: LABPT, INR,  in the last 72 hours  Neurovascular intact Sensation intact distally Intact pulses distally Dorsiflexion/Plantar flexion intact Incision: moderate drainage:  Serofang. Mid incision. No purulence.  Thigh soft with mild swelling as expected.  Assessment/Plan: 3 Days Post-Op Procedure(s) (LRB): OPEN REDUCTION INTERNAL FIXATION (ORIF) DISTAL FEMUR FRACTURE (Left) Discharge to SNF Ice packs for thigh to help with swelling.   WBAT, ASA for VTE proph.,  vicodin prn pain rx on chart.  OV 2 weeks.  Dressing change daily or as needed.  Vergil Burby M 11/25/2013, 11:13 AM

## 2013-11-26 LAB — BASIC METABOLIC PANEL
CO2: 30 mEq/L (ref 19–32)
GFR calc Af Amer: 86 mL/min — ABNORMAL LOW (ref >90)
GFR calc non Af Amer: 74 mL/min — ABNORMAL LOW (ref >90)
Glucose, Bld: 83 mg/dL (ref 70–99)
Potassium: 3.8 mEq/L (ref 3.7–5.3)
Sodium: 139 mEq/L (ref 137–147)

## 2013-11-26 LAB — HEMOGLOBIN AND HEMATOCRIT, BLOOD
HCT: 28.4 % — ABNORMAL LOW (ref 36.0–46.0)
Hemoglobin: 9.6 g/dL — ABNORMAL LOW (ref 12.0–15.0)

## 2013-11-26 MED ORDER — RISPERIDONE 0.5 MG PO TBDP
0.5000 mg | ORAL_TABLET | Freq: Every day | ORAL | Status: DC
  Administered 2013-11-26: 0.5 mg via ORAL
  Filled 2013-11-26 (×2): qty 1

## 2013-11-26 NOTE — Progress Notes (Signed)
Subjective: 4 Days Post-Op Procedure(s) (LRB): OPEN REDUCTION INTERNAL FIXATION (ORIF) DISTAL FEMUR FRACTURE (Left) Patient reports pain as mild.  Confused at night, oriented during the day. Slow progress with PT  Objective: Vital signs in last 24 hours: Temp:  [97.4 F (36.3 C)-98.2 F (36.8 C)] 98.2 F (36.8 C) (12/30 0500) Pulse Rate:  [58-115] 115 (12/30 0500) Resp:  [18-20] 20 (12/30 0500) BP: (116-177)/(50-53) 177/50 mmHg (12/30 0500) SpO2:  [99 %-100 %] 99 % (12/30 0500)  Intake/Output from previous day: 12/29 0701 - 12/30 0700 In: 430 [P.O.:430] Out: -  Intake/Output this shift:     Recent Labs  11/24/13 0755 11/25/13 0425  HGB 7.2* 8.9*    Recent Labs  11/24/13 0755 11/25/13 0425  WBC 7.8 8.1  RBC 2.38* 3.02*  HCT 21.0* 26.4*  PLT 96* 137*    Recent Labs  11/24/13 0755 11/25/13 0425  NA 133* 135  K 3.2* 3.7  CL 99 99  CO2 27 31  BUN 16 18  CREATININE 0.94 0.79  GLUCOSE 111* 102*  CALCIUM 7.8* 8.6   No results found for this basename: LABPT, INR,  in the last 72 hours  Neurologically intact  Assessment/Plan: 4 Days Post-Op Procedure(s) (LRB): OPEN REDUCTION INTERNAL FIXATION (ORIF) DISTAL FEMUR FRACTURE (Left) Up with therapy,  FL-2 signed.  WBATAnnell Conner C 11/26/2013, 7:48 AM

## 2013-11-26 NOTE — Progress Notes (Addendum)
[facility] PROGRESS NOTE  [name] ZOX:[id number] DOB: 14-Oct-1924 DOA: 11/20/2013 PCP: Georgianne Fick, MD  Assessment/Plan: #1left. Prosthetic femur fracture below hip prosthesis Secondary to mechanical fall. Patient has been seen by orthopedics. Patient is -s/p ORIF distal left femur. Per orthopedics. -Pain management.  -Ortho recommend when ok to d/c pt to SNF #2 severe aortic stenosis 2-D echo consistent with severe aortic stenosis. Mean gradient and peak gradients have increased his last 2-D echo. Patient has been seen by cardiology and recommend monitoring fluid status closely. -Cardiology recommending outpatient cardiology followup.  #3 hypertension -hypotension resolved and BPs uncontrolled today -continue lisinopril, hctz and metoprolol resumed on 12/26 per ortho  #4 ABLA Patient with no overt GIB. Follow h/h. Transfusion threshhold Hgb < 8. -hgb 8.9  today s/p transfusion of1 unit PRBC on 12/28  #5 hyperlipidemia Continue Zocor.  #6 prophylaxis SCDs for DVT prophylaxis.  #7Acute encephalopathy -meds vs sundowning -will start on respiridone and follow  Code Status: full Family Communication: none at bedside Disposition Plan: Awaiting SNF placement.   Consultants:  Orthopedics: Dr. Ophelia Charter  Cardiology Dr Myrtis Ser 11/21/13  Procedures:  2-D echo 11/21/2013  Chest x-ray 11/20/2013  X-ray of the left femur 11/20/2013  ORIF left distal femur fracture Dr Ophelia Charter 11/22/13  Antibiotics:  none  HPI/Subjective: Patient more confused overnight and also today.   Objective: Filed Vitals:   11/26/13 1612  BP: 158/54  Pulse: 59  Temp: 98.8 F (37.1 C)  Resp: 16    Intake/Output Summary (Last 24 hours) at 11/26/13 1628 Last data filed at 11/26/13 0610  Gross per 24 hour  Intake    100 ml  Output      0 ml  Net    100 ml   Filed Weights   11/20/13 1944  Weight: 72.576 kg (160 lb)    Exam:   General:  NAD. Alert and oriented x2.  confused  Cardiovascular: RRR WITH 3/6 sem  Respiratory: clear to auscultation bilaterally. No wheezes, no crackles, no rhonchi  Abdomen: soft, nontender, nondistended, positive bowel sounds.  Musculoskeletal: no clubbing cyanosis or edema.  Data Reviewed: Basic Metabolic Panel:  Recent Labs Lab 11/22/13 0500 11/23/13 0620 11/24/13 0755 11/25/13 0425 11/26/13 0715  NA 140 133* 133* 135 139  K 3.8 3.7 3.2* 3.7 3.8  CL 104 100 99 99 97  CO2 28 26 27 31 30   GLUCOSE 111* 92 111* 102* 83  BUN 14 9 16 18 20   CREATININE 0.76 0.76 0.94 0.79 0.74  CALCIUM 8.4 7.9* 7.8* 8.6 9.0   Liver Function Tests: No results found for this basename: AST, ALT, ALKPHOS, BILITOT, PROT, ALBUMIN,  in the last 168 hours No results found for this basename: LIPASE, AMYLASE,  in the last 168 hours No results found for this basename: AMMONIA,  in the last 168 hours CBC:  Recent Labs Lab 11/20/13 2200 11/22/13 0500 11/23/13 0620 11/24/13 0755 11/25/13 0425 11/26/13 0715  WBC 7.9 6.3 9.4 7.8 8.1  --   NEUTROABS 6.6  --   --   --   --   --   HGB 10.0* 8.4* 8.1* 7.2* 8.9* 9.6*  HCT 31.3* 26.2* 24.4* 21.0* 26.4* 28.4*  MCV 92.9 93.2 88.7 88.2 87.4  --   PLT 154 120* 96* 96* 137*  --    Cardiac Enzymes: No results found for this basename: CKTOTAL, CKMB, CKMBINDEX, TROPONINI,  in the last 168 hours BNP (last 3 results) No results found for this basename: PROBNP,  in  the last 8760 hours CBG: No results found for this basename: GLUCAP,  in the last 168 hours  Recent Results (from the past 240 hour(s))  URINE CULTURE     Status: None   Collection Time    11/20/13 10:00 PM      Result Value Range Status   Specimen Description URINE, RANDOM   Final   Special Requests NONE   Final   Culture  Setup Time     Final   Value: 11/21/2013 03:52     Performed at [facility] Count     Final   Value: NO GROWTH     Performed at [facility]   Culture     Final   Value: NO  GROWTH     Performed at [facility]   Report Status 11/21/2013 FINAL   Final  SURGICAL PCR SCREEN     Status: None   Collection Time    11/21/13  2:44 AM      Result Value Range Status   MRSA, PCR NEGATIVE  NEGATIVE Final   Staphylococcus aureus NEGATIVE  NEGATIVE Final   Comment:            The Xpert SA Assay (FDA     approved for NASAL specimens     in patients over 26 years of age),     is one component of     a comprehensive surveillance     program.  Test performance has     been validated by [employer] for patients greater     than or equal to 27 year old.     It is not intended     to diagnose infection nor to     guide or monitor treatment.     Studies: No results found.  Scheduled Meds: . aspirin EC  325 mg Oral Q breakfast  . docusate sodium  100 mg Oral BID  . hydrochlorothiazide  25 mg Oral Daily  . lisinopril  40 mg Oral Daily  . metoprolol  100 mg Oral BID  . PARoxetine  20 mg Oral Daily  . risperiDONE  0.5 mg Oral QPM   Continuous Infusions:    Principal Problem:   Closed displaced oblique fracture of shaft of left femur Active Problems:   HTN (hypertension)   Systolic murmur   Hypercholesterolemia   Fracture, femur, shaft   Aortic stenosis, severe: PER 2 D ECHO 11/21/13   Acute blood loss anemia    Time spent: 25 minutes    Kela Millin MD [facility] Pager [phone]. If 7PM-7AM, please contact night-coverage at [email or web address], password [facility] 11/26/2013, 4:28 PM  LOS: 6 days

## 2013-11-27 MED ORDER — POLYETHYLENE GLYCOL 3350 17 G PO PACK: 17.0000 g | PACK | Freq: Every day | ORAL | Status: DC | PRN

## 2013-11-27 MED ORDER — RISPERIDONE 0.5 MG PO TBDP: 0.5000 mg | ORAL_TABLET | Freq: Every day | ORAL | Status: DC

## 2013-11-27 MED ORDER — METOPROLOL TARTRATE 50 MG PO TABS: 50.0000 mg | ORAL_TABLET | Freq: Two times a day (BID) | ORAL | Status: DC

## 2013-11-27 MED ORDER — LISINOPRIL 20 MG PO TABS: 20.0000 mg | ORAL_TABLET | Freq: Every day | ORAL | Status: DC

## 2013-11-27 NOTE — Progress Notes (Signed)
Physical Therapy Treatment Patient Details Name: [name] MRN: [id number] DOB: 07-28-1924 Today's Date: 11/27/2013 Time: 7829-5621 PT Time Calculation (min): 24 min  PT Assessment / Plan / Recommendation  History of Present Illness [name] is an 79 y.o. female with hx of bilateral total hips, hx of heart murmur, had a mechanical fall  sustaining left distal femur fx.  Pt s/p left ORIF distal femur.    PT Comments   Pt cont's to require +2 assist for mobility but she was able to take ~4-5 steps today.    Follow Up Recommendations  SNF;Supervision/Assistance - 24 hour     Does the patient have the potential to tolerate intense rehabilitation     Barriers to Discharge        Equipment Recommendations  None recommended by PT    Recommendations for Other Services    Frequency Min 3X/week   Progress towards PT Goals Progress towards PT goals: Progressing toward goals (slowly)  Plan Current plan remains appropriate;Frequency needs to be updated    Precautions / Restrictions Precautions Precautions: Fall Restrictions LLE Weight Bearing: Weight bearing as tolerated    Pertinent Vitals/Pain Does not state pain but has difficulty WBing through LLE as well as does not tolerate hip flexion well.      Mobility  Bed Mobility Bed Mobility: Supine to Sit;Sitting - Scoot to Edge of Bed Supine to Sit: 1: +2 Total assist;With rails;HOB elevated Supine to Sit: Patient Percentage: 20% Sitting - Scoot to Edge of Bed: 1: +1 Total assist Details for Bed Mobility Assistance: Max directional cueing for technique.  (A) for LE's, to bring shoulders/trunk to sitting upright, use of draw pad to pivot hips & scoot closer to EOB.   Transfers Transfers: Sit to Stand;Stand to Sit;Stand Pivot Transfers Sit to Stand: 1: +2 Total assist;With upper extremity assist;From bed;From chair/3-in-1;With armrests Sit to Stand: Patient Percentage: 60% Stand to Sit: 1: +2 Total assist;With upper  extremity assist;With armrests;To chair/3-in-1 Stand to Sit: Patient Percentage: 60% Stand Pivot Transfers: 1: +2 Total assist Stand Pivot Transfers: Patient Percentage: 60% Details for Transfer Assistance: cues for hand placement, technique, use of RW, flexion at hips to initiate standing & with descent, & anterior weight shifting over BOS while standing.   Ambulation/Gait Ambulation/Gait Assistance: 1: +2 Total assist Ambulation/Gait: Patient Percentage: 70% Ambulation Distance (Feet):  (4 steps fowards) Assistive device: Rolling walker Ambulation/Gait Assistance Details: cues for sequencing, RW advancement, & safety.  Pt able to advance RLE by herself but with minimal floor clearance.   Gait Pattern: Step-to pattern;Decreased weight shift to left;Trunk flexed;Shuffle Stairs: No Wheelchair Mobility Wheelchair Mobility: No      PT Goals (current goals can now be found in the care plan section) Acute Rehab PT Goals Patient Stated Goal: To go home PT Goal Formulation: With patient Time For Goal Achievement: 12/07/13 Potential to Achieve Goals: Good  Visit Information  Last PT Received On: 11/27/13 Assistance Needed: +2 History of Present Illness: [name] is an 79 y.o. female with hx of bilateral total hips, hx of heart murmur, had a mechanical fall  sustaining left distal femur fx.  Pt s/p left ORIF distal femur.     Subjective Data  Patient Stated Goal: To go home   Cognition  Cognition Arousal/Alertness: Awake/alert Behavior During Therapy: WFL for tasks assessed/performed Overall Cognitive Status: Impaired/Different from baseline Area of Impairment: Orientation;Safety/judgement;Problem solving Orientation Level: Place Safety/Judgement: Decreased awareness of safety;Decreased awareness of deficits Problem Solving: Slow processing;Decreased  initiation;Difficulty sequencing;Requires verbal cues;Requires tactile cues General Comments: When asked where she was she  responded with "I think i'm at nursing home but they tell me i'm at the hospital".      Balance  Static Sitting Balance Static Sitting - Balance Support: Feet supported Static Sitting - Level of Assistance: 4: Min assist Static Sitting - Comment/# of Minutes: (A) for anterior weight shifting of trunk over hips due to pt leaning backwards.    End of Session PT - End of Session Equipment Utilized During Treatment: Gait belt Activity Tolerance: Patient tolerated treatment well Patient left: in chair;with call bell/phone within reach Nurse Communication: Mobility status   GP     Lara Mulch 11/27/2013, 11:56 AM   Verdell Face, PTA [phone] 11/27/2013

## 2013-11-27 NOTE — Discharge Summary (Signed)
Physician Discharge Summary  [name] NFA:[id number] DOB: [birth year] DOA: 11/20/2013  PCP: Georgianne Fick, MD  Admit date: 11/20/2013 Discharge date: 11/27/2013  Time spent: >30 minutes  Recommendations for Outpatient Follow-up:  Follow-up Information   Follow up with Eldred Manges, MD In 2 weeks.   Specialty:  Orthopedic Surgery   Contact information:   [address] [place] [address] [phone]       Follow up with Willa Rough, MD. (1-2 weeks, call for appointment upon discharge)    Specialty:  Cardiology   Contact information:   [address] [place] [address] [phone]       Please follow up. (SNF M.D. in one to 2 days)        Discharge Diagnoses:  Principal Problem:   Closed displaced oblique fracture of shaft of left femur Active Problems:   HTN (hypertension)   Systolic murmur   Hypercholesterolemia   Fracture, femur, shaft   Aortic stenosis, severe: PER 2 D ECHO 11/21/13   Acute blood loss anemia   Discharge Condition: Improved/stable  Diet recommendation:   Filed Weights   11/20/13 1944  Weight: 72.576 kg (160 lb)    History of present illness:  The patient is a 79 y.o. female with hx of bilateral total hips, hx of heart murmur, had a mechanical fall tonight without LOC, and has a left midshaft femur Fx. She has pain on her left leg. X ray showed significantly displace Fx of the femur. Orthopedics Dr Ophelia Charter was consulted by EDP, planned to do ORIF later today, and asked hospitalist to admit her. She denied HA, chest pain, SOB, fever, chills, or dysuria. Work up also included normal renal fx test, no leukocytosis, Hb of 10grams per dL, and clear CXR. She was admitted for further evaluation and management.  Hospital Course:  #1left. Prosthetic femur fracture below hip prosthesis  Secondary to mechanical fall.  Upon admission orthopedics was consulted and followed patient and she was taken to surgery- s/p ORIF  distal left femur 12/26. -She was placed on narcotics for pain management -PT was consulted for patient and recommended skilled nursing for rehabilitation. Also recommended weightbearing as tolerated and rehabilitation at nursing facility. She is to followup with Dr. Ophelia Charter as directed in 2 weeks. #2 severe aortic stenosis  2-D echo consistent with severe aortic stenosis. Mean gradient and peak gradients have increased his last 2-D echo. Patient has been seen by cardiology and recommend monitoring fluid status closely.  -Cardiology recommending outpatient followup with Dr. Ophelia Charter on discharge.  #3 hypertension  -Patient had episodes of hypotension and her outpatient medications were  held initially and subsequently resumed. On followup today she was noted to have decrease in the 90s and so her antihypertensives were held again. She has been on HCTZ and so will be given a bolus of normal saline and if blood pressures stabilized plan is to discharge to nursing home. The HCTZ has been discontinued and her lisinopril and metoprolol dose is decreased upon discharge. #4 ABLA, postop Patient with no overt GIB. Follow h/h. Transfusion threshhold Hgb < 8.  -Hemoglobin dropped to 7.2 on 12/28 and she was transfused 1 unit of packed red blood cells and hemoglobin improved to 8.9 on followup prior to this remained stable at 9.6 #5 hyperlipidemia  Continue Zocor.  #6 prophylaxis  Patient was placed on aspirin per orthopedics for postop DVT prophylaxis #7Acute encephalopathy  -While in the hospital on patient was noted to have intermittent worsening  of confusion and the impression was that it was related to meds vs sundowning  -She was started on respiridone with clinical improvement and is to continue this upon discharge.  Consultants:  Orthopedics: Dr. Ophelia Charter  Cardiology Dr Myrtis Ser 11/21/13 Procedures:  2-D echo 11/21/2013  Chest x-ray 11/20/2013  X-ray of the left femur 11/20/2013  ORIF left distal femur  fracture Dr Ophelia Charter 11/22/13      Discharge Exam: Filed Vitals:   11/27/13 1042  BP: 94/48  Pulse: 64  Temp: 98.2 F (36.8 C)  Resp: 16   Exam:  General: NAD. Alert and appropriate Cardiovascular: RRR WITH 3/6 sem  Respiratory: clear to auscultation bilaterally. No wheezes, no crackles, no rhonchi  Abdomen: soft, nontender, nondistended, positive bowel sounds.  Musculoskeletal: no clubbing cyanosis or edema.   Discharge Instructions  Discharge Orders   Future Orders Complete By Expires   Diet general  As directed    Increase activity slowly  As directed    Weight bearing as tolerated  As directed    Questions:     Laterality:     Extremity:         Medication List    STOP taking these medications       hydrochlorothiazide 25 MG tablet  Commonly known as:  HYDRODIURIL      TAKE these medications       aspirin 325 MG EC tablet  Take 1 tablet (325 mg total) by mouth daily with breakfast.     HYDROcodone-acetaminophen 5-325 MG per tablet  Commonly known as:  NORCO  Take 2 tablets by mouth every 4 (four) hours as needed for moderate pain or severe pain.     lisinopril 20 MG tablet  Commonly known as:  PRINIVIL,ZESTRIL  Take 1 tablet (20 mg total) by mouth daily.  Start taking on:  11/28/2013     lovastatin 40 MG tablet  Commonly known as:  MEVACOR     metoprolol 50 MG tablet  Commonly known as:  LOPRESSOR  Take 1 tablet (50 mg total) by mouth 2 (two) times daily.     PARoxetine 20 MG tablet  Commonly known as:  PAXIL  Take 20 mg by mouth daily.     polyethylene glycol packet  Commonly known as:  MIRALAX / GLYCOLAX  Take 17 g by mouth daily as needed for mild constipation.     risperiDONE 0.5 MG disintegrating tablet  Commonly known as:  RISPERDAL M-TABS  Take 1 tablet (0.5 mg total) by mouth at bedtime.       No Known Allergies     Follow-up Information   Follow up with YATES,MARK C, MD In 2 weeks.   Specialty:  Orthopedic Surgery   Contact  information:   [address] [address] [place] [address] [phone]       Follow up with Willa Rough, MD. (1-2 weeks, call for appointment upon discharge)    Specialty:  Cardiology   Contact information:   [address] [place] [address] [phone]       Please follow up. (SNF M.D. in one to 2 days)        The results of significant diagnostics from this hospitalization (including imaging, microbiology, ancillary and laboratory) are listed below for reference.    Significant Diagnostic Studies: Dg Femur Left  11/22/2013   CLINICAL DATA:  Periprosthetic fracture.  EXAM: LEFT FEMUR - 2 VIEW; DG C-ARM 1-60 MIN  COMPARISON:  11/20/2013  FINDINGS: C-arm films  document open reduction internal fixation of a mid shaft femur fracture below a left total hip arthroplasty. Satisfactory position and alignment.  IMPRESSION: No adverse features.   Electronically Signed   By: Davonna Belling M.D.   On: 11/22/2013 14:04   Dg Pelvis Portable  11/20/2013   CLINICAL DATA:  Status post fall; left hip pain.  EXAM: PORTABLE PELVIS 1-2 VIEWS  COMPARISON:  None.  FINDINGS: There is a significantly displaced oblique fracture extending across the left mid femoral shaft. This demonstrates approximately 1 shaft width anterior and likely medial displacement, with 6 cm of shortening at the fracture site. This is just distal to the distal aspect of the left femoral prosthesis. The femoral prostheses are unremarkable in appearance bilaterally. There is no definite evidence of loosening.  The pelvis is difficult to assess due to limitations in positioning and diffuse osteopenia. The visualized bowel gas pattern is grossly unremarkable.  IMPRESSION: 1. Significantly displaced oblique fracture extending across the left mid femoral shaft, with approximately 1 shaft width anterior and likely medial displacement, and 6 cm of shortening at the fracture site. This is just distal to the distal aspect of the  left femoral prosthesis. The femoral prostheses are unremarkable in appearance bilaterally. 2. Diffuse osteopenia with regard to the visualized osseous structures.   Electronically Signed   By: Roanna Raider M.D.   On: 11/20/2013 21:11   Dg Chest Port 1 View  11/20/2013   CLINICAL DATA:  Preoperative radiograph.  Left femur fracture.  EXAM: PORTABLE CHEST - 1 VIEW  COMPARISON:  08/22/2013.  FINDINGS: Osteopenia. Heart size upper limits of normal for projection. Tortuous thoracic aorta. Probable ectasia of the ascending thoracic aorta. Healed proximal right humerus fracture with severe posttraumatic right glenohumeral osteoarthritis. There is no airspace disease, pneumothorax or pleural effusion.  IMPRESSION: No acute cardiopulmonary disease.  Chronic changes of the chest.   Electronically Signed   By: Andreas Newport M.D.   On: 11/20/2013 22:30   Dg Femur Left Port  11/20/2013   CLINICAL DATA:  Femur fracture  EXAM: PORTABLE LEFT FEMUR - 2 VIEW  COMPARISON:  Prior radiograph from 06/05/2013  FINDINGS: There is an acute oblique fracture through the midshaft of the left femur with medial angulation and anterior displacement. There is foreshortening with approximately 10 cm of overlap.  A left total hip arthroplasty is grossly intact without evidence of failure or complication. Diffuse osteopenia. Prominent vascular calcifications within the leg. Severe degenerative osteoarthrosis noted at the visualized knee.  IMPRESSION: 1. Acute oblique fracture through the midshaft of the left femur with anteromedial displacement, medial angulation, and approximately 10 cm of foreshortening/overlap.  2.   Grossly intact left total hip arthroplasty.   Electronically Signed   By: Rise Mu M.D.   On: 11/20/2013 22:37   Dg C-arm 1-60 Min  11/22/2013   CLINICAL DATA:  Periprosthetic fracture.  EXAM: LEFT FEMUR - 2 VIEW; DG C-ARM 1-60 MIN  COMPARISON:  11/20/2013  FINDINGS: C-arm films document open reduction  internal fixation of a mid shaft femur fracture below a left total hip arthroplasty. Satisfactory position and alignment.  IMPRESSION: No adverse features.   Electronically Signed   By: Davonna Belling M.D.   On: 11/22/2013 14:04    Microbiology: Recent Results (from the past 240 hour(s))  URINE CULTURE     Status: None   Collection Time    11/20/13 10:00 PM      Result Value Range Status   Specimen Description URINE,  RANDOM   Final   Special Requests NONE   Final   Culture  Setup Time     Final   Value: 11/21/2013 03:52     Performed at [facility] Count     Final   Value: NO GROWTH     Performed at [facility]   Culture     Final   Value: NO GROWTH     Performed at [facility]   Report Status 11/21/2013 FINAL   Final  SURGICAL PCR SCREEN     Status: None   Collection Time    11/21/13  2:44 AM      Result Value Range Status   MRSA, PCR NEGATIVE  NEGATIVE Final   Staphylococcus aureus NEGATIVE  NEGATIVE Final   Comment:            The Xpert SA Assay (FDA     approved for NASAL specimens     in patients over 2 years of age),     is one component of     a comprehensive surveillance     program.  Test performance has     been validated by [employer] for patients greater     than or equal to 108 year old.     It is not intended     to diagnose infection nor to     guide or monitor treatment.     Labs: Basic Metabolic Panel:  Recent Labs Lab 11/22/13 0500 11/23/13 0620 11/24/13 0755 11/25/13 0425 11/26/13 0715  NA 140 133* 133* 135 139  K 3.8 3.7 3.2* 3.7 3.8  CL 104 100 99 99 97  CO2 28 26 27 31 30   GLUCOSE 111* 92 111* 102* 83  BUN 14 9 16 18 20   CREATININE 0.76 0.76 0.94 0.79 0.74  CALCIUM 8.4 7.9* 7.8* 8.6 9.0   Liver Function Tests: No results found for this basename: AST, ALT, ALKPHOS, BILITOT, PROT, ALBUMIN,  in the last 168 hours No results found for this basename: LIPASE, AMYLASE,  in the last 168 hours No  results found for this basename: AMMONIA,  in the last 168 hours CBC:  Recent Labs Lab 11/20/13 2200 11/22/13 0500 11/23/13 0620 11/24/13 0755 11/25/13 0425 11/26/13 0715  WBC 7.9 6.3 9.4 7.8 8.1  --   NEUTROABS 6.6  --   --   --   --   --   HGB 10.0* 8.4* 8.1* 7.2* 8.9* 9.6*  HCT 31.3* 26.2* 24.4* 21.0* 26.4* 28.4*  MCV 92.9 93.2 88.7 88.2 87.4  --   PLT 154 120* 96* 96* 137*  --    Cardiac Enzymes: No results found for this basename: CKTOTAL, CKMB, CKMBINDEX, TROPONINI,  in the last 168 hours BNP: BNP (last 3 results) No results found for this basename: PROBNP,  in the last 8760 hours CBG: No results found for this basename: GLUCAP,  in the last 168 hours     Signed:  Clancey Welton C  [facility] 11/27/2013, 12:50 PM

## 2013-11-29 NOTE — Op Note (Signed)
[facility]:  Mesch, Kadesha                ACCOUNT NO.:  [id number]  MEDICAL RECORD NO.:  [id number]  LOCATION:  [id number]                        FACILITY:  [facility]  PHYSICIAN:  [name], M.D.    DATE OF BIRTH:  09-01-1924  DATE OF PROCEDURE:  11/22/2013 DATE OF DISCHARGE:  11/27/2013                              OPERATIVE REPORT   PREOPERATIVE DIAGNOSIS:  Left periprosthetic femur fracture, femoral shaft.  POSTOPERATIVE DIAGNOSIS:  Left periprosthetic femur fracture, femoral shaft.  PROCEDURE:  Open reduction and internal fixation plate fixation left midshaft femur fracture.  SURGEON:  Laird Runnion C. Ophelia Conner, M.D.  ASSISTANT:  Kerrin ChampagneJames E. Nitka, M.D.  ANESTHESIA:  General.  ESTIMATED BLOOD LOSS:  700.  BLOOD PRODUCTS:  2 units packed cells.  DRAINS:  1 Hemovac.  IMPLANTS:  Zimmer lateral femoral plate with inter frag screws and Dall- Miles cables.  DESCRIPTION OF PROCEDURE:  After induction of general anesthesia, the patient was placed in lateral position on a flat Jackson table. Standard prepping and draping was performed.  Foley catheter had been placed.  The patient had well-fixed cemented hemiarthroplasty present and had fractured below the stem extending spiral fracture with significant shortening.  Lateral incision was made extending the old hip incision down the lateral aspect.  Tensor fascia was split.  Time-out procedure was completed before starting the surgery, and antibiotics were given prophylactically.  Vas lateralis pulled anteriorly.  Small perforating bleeders from the bur fundi were caught and coagulated.  The fracture was reduced with large bone-holding clamps with difficulty. Assistance of Dr. Otelia SergeantNitka was necessary holding traction out with the knee in flexed position, and continued repeat reduction maneuvers were performed until finally there was excellent alignment position and confirmed by x-ray with plate held in appropriate position.  Bicortical fixations were  applied distally and a combination of cerclage cables and screws were applied.  AP and lateral x-rays were used to confirm that there was good position reduction, good inter frag fixation and no shortening, and there was a with good rotational position.  Proximal cables were all tightened down, and there were 3 cables proximal to the cement restrictor, 2 bicortical screws that lag the fracture site and then below combination of cables and screws.  A total of 6 cables were placed and 4 bicortical screws.  Position alignment was anatomic. Good rotation and good hip range of motion.  After copious irrigation. vastus lateralis was repaired, tensor fascia was repaired with nonabsorbable 0 and #1 sutures, 2-0 Vicryl on subcutaneous tissue, skin staple closure, postop dressing and knee immobilizer.  Instrument count and needle count was correct.     [name], M.D.     MCY/MEDQ  D:  11/29/2013  T:  11/29/2013  Job:  [id number]

## 2013-12-06 ENCOUNTER — Encounter (HOSPITAL_COMMUNITY): Payer: Self-pay | Admitting: Emergency Medicine

## 2013-12-06 ENCOUNTER — Emergency Department (HOSPITAL_COMMUNITY)
Admission: EM | Admit: 2013-12-06 | Discharge: 2013-12-06 | Disposition: A | Discharge status: Skilled Nursing Facility | Payer: Medicare Other | Attending: Emergency Medicine | Admitting: Emergency Medicine

## 2013-12-06 DIAGNOSIS — Z79899 Other long term (current) drug therapy: Secondary | ICD-10-CM | POA: Insufficient documentation

## 2013-12-06 DIAGNOSIS — D649 Anemia, unspecified: Secondary | ICD-10-CM | POA: Insufficient documentation

## 2013-12-06 DIAGNOSIS — E785 Hyperlipidemia, unspecified: Secondary | ICD-10-CM | POA: Insufficient documentation

## 2013-12-06 DIAGNOSIS — Z96649 Presence of unspecified artificial hip joint: Secondary | ICD-10-CM | POA: Insufficient documentation

## 2013-12-06 DIAGNOSIS — Z8781 Personal history of (healed) traumatic fracture: Secondary | ICD-10-CM | POA: Insufficient documentation

## 2013-12-06 DIAGNOSIS — I10 Essential (primary) hypertension: Secondary | ICD-10-CM | POA: Insufficient documentation

## 2013-12-06 DIAGNOSIS — Z8739 Personal history of other diseases of the musculoskeletal system and connective tissue: Secondary | ICD-10-CM | POA: Insufficient documentation

## 2013-12-06 DIAGNOSIS — Z7982 Long term (current) use of aspirin: Secondary | ICD-10-CM | POA: Insufficient documentation

## 2013-12-06 LAB — BASIC METABOLIC PANEL
BUN: 23 mg/dL (ref 6–23)
CHLORIDE: 106 meq/L (ref 96–112)
CO2: 25 meq/L (ref 19–32)
Calcium: 8.6 mg/dL (ref 8.4–10.5)
Creatinine, Ser: 0.84 mg/dL (ref 0.50–1.10)
GFR calc Af Amer: 70 mL/min — ABNORMAL LOW (ref >90)
GFR calc non Af Amer: 61 mL/min — ABNORMAL LOW (ref >90)
Glucose, Bld: 97 mg/dL (ref 70–99)
Potassium: 4.5 mEq/L (ref 3.7–5.3)
SODIUM: 140 meq/L (ref 137–147)

## 2013-12-06 LAB — CBC WITH DIFFERENTIAL/PLATELET
Basophils Absolute: 0 10*3/uL (ref 0.0–0.1)
Basophils Relative: 0 % (ref 0–1)
Eosinophils Absolute: 0.6 10*3/uL (ref 0.0–0.7)
Eosinophils Relative: 10 % — ABNORMAL HIGH (ref 0–5)
HCT: 25.9 % — ABNORMAL LOW (ref 36.0–46.0)
Hemoglobin: 8.4 g/dL — ABNORMAL LOW (ref 12.0–15.0)
Lymphocytes Relative: 30 % (ref 12–46)
Lymphs Abs: 1.7 10*3/uL (ref 0.7–4.0)
MCH: 29.7 pg (ref 26.0–34.0)
MCHC: 32.4 g/dL (ref 30.0–36.0)
MCV: 91.5 fL (ref 78.0–100.0)
Monocytes Absolute: 0.6 10*3/uL (ref 0.1–1.0)
Monocytes Relative: 10 % (ref 3–12)
Neutro Abs: 2.7 10*3/uL (ref 1.7–7.7)
Neutrophils Relative %: 49 % (ref 43–77)
Platelets: 357 10*3/uL (ref 150–400)
RBC: 2.83 MIL/uL — ABNORMAL LOW (ref 3.87–5.11)
RDW: 15.7 % — ABNORMAL HIGH (ref 11.5–15.5)
WBC: 5.5 10*3/uL (ref 4.0–10.5)

## 2013-12-06 LAB — TYPE AND SCREEN
ABO/RH(D): O POS
Antibody Screen: NEGATIVE

## 2013-12-06 NOTE — Discharge Instructions (Signed)

## 2013-12-06 NOTE — ED Notes (Signed)
Pt comes from [facility] with a c/o abnormal labs. Pt states she does not have any complaints, doesn't feel any different than normal. B/p 120/64, 60 pulse, 16 resp, 96% RA.

## 2013-12-06 NOTE — ED Provider Notes (Signed)
CSN: [id number]     Arrival date & time 12/06/13  1449 History   First MD Initiated Contact with Patient 12/06/13 1457     Chief Complaint  Patient presents with  . Abnormal Labs    (Consider location/radiation/quality/duration/timing/severity/associated sxs/prior Treatment) HPI  This is an 80 year old female with history of aortic stenosis, hypertension, hyperlipidemia and recent hip fracture status post OR repair who presents with anemia. Patient was seen on December 26 and found to have a left hip fracture. She underwent open reduction with internal fixation. She required one unit of packed red blood cells while in the hospital for acute blood loss anemia. There is no significant reportable loss during surgery and the patient had no evidence of GI bleed at that time. She has been in rehabilitation. She states that she's feeling well and is carrying out her physical therapy. Basic labwork today showed a hemoglobin of 7.5. Last documented hemoglobin was 9.6 at the facility.  Patient denies any syncope or dizziness. She denies any shortness of breath, chest pain, abdominal pain.  Past Medical History  Diagnosis Date  . Severe aortic stenosis     a. 10/2013 Echo: EF 65-70%, mild conc LVH, Gr 1 DD, sev AS, mild to mod AI, Valve area: 0.89cm^2 (VTI), 0.85cm^2 (Vmax), mildly dil LA, mildly dil RA, PASP .  . Osteoarthritis     a. 04/2005 s/p L total hip arthroplasty  . Osteopenia   . Femur fracture, left     a. 10/2013 Acute oblique Fx through te midshaft of the left femur w/ anteromedial displacement, medial angulation, and ~ 10 cm foreshortening/overlap.  Marland Kitchen Hypertension   . Hyperlipidemia    Past Surgical History  Procedure Laterality Date  . Abdominal hysterectomy    . Total hip arthroplasty Bilateral   . Orif femur fracture Left 11/22/2013  . Orif femur fracture Left 11/22/2013    Procedure: OPEN REDUCTION INTERNAL FIXATION (ORIF) DISTAL FEMUR FRACTURE;  Surgeon: Eldred Manges, MD;   Location: [facility] OR;  Service: Orthopedics;  Laterality: Left;   Family History  Problem Relation Age of Onset  . Heart attack Mother     MI in her 66's  . Stroke Father     died in his 23's.   History  Substance Use Topics  . Smoking status: Never Smoker   . Smokeless tobacco: Never Used  . Alcohol Use: No   OB History   Grav Para Term Preterm Abortions TAB SAB Ect Mult Living                 Review of Systems  Constitutional: Negative for fever.  Respiratory: Negative for cough, chest tightness and shortness of breath.   Cardiovascular: Negative for chest pain.  Gastrointestinal: Negative for nausea, vomiting and abdominal pain.  Genitourinary: Negative for dysuria.  Neurological: Negative for dizziness, syncope and headaches.  Psychiatric/Behavioral: Negative for confusion.  All other systems reviewed and are negative.    Allergies  Review of patient's allergies indicates no known allergies.  Home Medications   Current Outpatient Rx  Name  Route  Sig  Dispense  Refill  . aspirin EC 325 MG EC tablet   Oral   Take 1 tablet (325 mg total) by mouth daily with breakfast.   30 tablet   0   . HYDROcodone-acetaminophen (NORCO) 5-325 MG per tablet   Oral   Take 2 tablets by mouth every 4 (four) hours as needed for moderate pain or severe pain.   40 tablet  0   . lisinopril (PRINIVIL,ZESTRIL) 20 MG tablet   Oral   Take 1 tablet (20 mg total) by mouth daily.         Marland Kitchen. lovastatin (MEVACOR) 40 MG tablet   Oral   Take 40 mg by mouth daily.          . metoprolol (LOPRESSOR) 50 MG tablet   Oral   Take 1 tablet (50 mg total) by mouth 2 (two) times daily.         Marland Kitchen. PARoxetine (PAXIL) 20 MG tablet   Oral   Take 20 mg by mouth daily.         . polyethylene glycol (MIRALAX / GLYCOLAX) packet   Oral   Take 17 g by mouth daily as needed for mild constipation.   14 each   0   . risperiDONE (RISPERDAL) 0.25 MG tablet   Oral   Take 0.25 mg by mouth at  bedtime.          BP 133/63  Pulse 105  Temp(Src) 98.1 F (36.7 C) (Oral)  Resp 20  SpO2 98% Physical Exam  Nursing note and vitals reviewed. Constitutional: She is oriented to person, place, and time. No distress.  elderly  HENT:  Head: Normocephalic and atraumatic.  Mouth/Throat: Oropharynx is clear and moist.  Eyes: Pupils are equal, round, and reactive to light.  Conjunctiva pale  Neck: Neck supple.  Cardiovascular: Normal rate, regular rhythm and normal heart sounds.   No murmur heard. Pulmonary/Chest: Effort normal and breath sounds normal. No respiratory distress.  Abdominal: Soft. Bowel sounds are normal. There is no tenderness.  Musculoskeletal:  Surgical incision over the left lateral hip clean dry and intact  Neurological: She is alert and oriented to person, place, and time.  Skin: Skin is warm and dry.  Psychiatric: She has a normal mood and affect.    ED Course  Procedures (including critical care time) Labs Review Labs Reviewed  CBC WITH DIFFERENTIAL - Abnormal; Notable for the following:    RBC 2.83 (*)    Hemoglobin 8.4 (*)    HCT 25.9 (*)    RDW 15.7 (*)    Eosinophils Relative 10 (*)    All other components within normal limits  BASIC METABOLIC PANEL - Abnormal; Notable for the following:    GFR calc non Af Amer 61 (*)    GFR calc Af Amer 70 (*)    All other components within normal limits  TYPE AND SCREEN   Imaging Review No results found.  EKG Interpretation   None       MDM   1. Anemia    Patient presents with anemia found him outside labs. She recently had left hip ORIF. Hemoglobin at discharge was 8.9. Patient is asymptomatic and vital signs are within normal limits. Incision looks clean dry and intact without evidence of infection. Repeat labs here show hemoglobin of 8.4.  I discussed with the facility who agrees to hold off on transfusion at this time. Patient will get a repeat hemoglobin on Monday.  After history, exam, and  medical workup I feel the patient has been appropriately medically screened and is safe for discharge home. Pertinent diagnoses were discussed with the patient. Patient was given return precautions.     Shon Batonourtney F Docia Klar, MD 12/06/13 [phone]

## 2013-12-06 NOTE — ED Notes (Signed)
Waiting on [facility] to transport pt back to [facility]

## 2013-12-06 NOTE — ED Notes (Signed)
Pt was sent here for Hemoglobin of 7.5. Pt denies any pain, weakness, or any other sx. Pt states she feels no different than normal.

## 2013-12-10 ENCOUNTER — Emergency Department (HOSPITAL_COMMUNITY)
Admission: EM | Admit: 2013-12-10 | Discharge: 2013-12-10 | Disposition: A | Discharge status: Home / Self Care | Payer: Medicare Other | Attending: Emergency Medicine | Admitting: Emergency Medicine

## 2013-12-10 ENCOUNTER — Encounter (HOSPITAL_COMMUNITY): Payer: Self-pay | Admitting: Emergency Medicine

## 2013-12-10 DIAGNOSIS — I10 Essential (primary) hypertension: Secondary | ICD-10-CM | POA: Insufficient documentation

## 2013-12-10 DIAGNOSIS — Z8781 Personal history of (healed) traumatic fracture: Secondary | ICD-10-CM | POA: Insufficient documentation

## 2013-12-10 DIAGNOSIS — E785 Hyperlipidemia, unspecified: Secondary | ICD-10-CM | POA: Insufficient documentation

## 2013-12-10 DIAGNOSIS — Z96649 Presence of unspecified artificial hip joint: Secondary | ICD-10-CM | POA: Insufficient documentation

## 2013-12-10 DIAGNOSIS — Z79899 Other long term (current) drug therapy: Secondary | ICD-10-CM | POA: Insufficient documentation

## 2013-12-10 DIAGNOSIS — R011 Cardiac murmur, unspecified: Secondary | ICD-10-CM | POA: Insufficient documentation

## 2013-12-10 DIAGNOSIS — Z7982 Long term (current) use of aspirin: Secondary | ICD-10-CM | POA: Insufficient documentation

## 2013-12-10 DIAGNOSIS — Z8679 Personal history of other diseases of the circulatory system: Secondary | ICD-10-CM | POA: Insufficient documentation

## 2013-12-10 DIAGNOSIS — Z9889 Other specified postprocedural states: Secondary | ICD-10-CM | POA: Insufficient documentation

## 2013-12-10 DIAGNOSIS — M1990 Unspecified osteoarthritis, unspecified site: Secondary | ICD-10-CM | POA: Insufficient documentation

## 2013-12-10 DIAGNOSIS — D649 Anemia, unspecified: Secondary | ICD-10-CM | POA: Insufficient documentation

## 2013-12-10 LAB — CBC WITH DIFFERENTIAL/PLATELET
Basophils Absolute: 0 10*3/uL (ref 0.0–0.1)
Basophils Relative: 1 % (ref 0–1)
EOS ABS: 0.3 10*3/uL (ref 0.0–0.7)
EOS PCT: 8 % — AB (ref 0–5)
HEMATOCRIT: 26.2 % — AB (ref 36.0–46.0)
HEMOGLOBIN: 8.3 g/dL — AB (ref 12.0–15.0)
LYMPHS ABS: 1.1 10*3/uL (ref 0.7–4.0)
LYMPHS PCT: 26 % (ref 12–46)
MCH: 29.1 pg (ref 26.0–34.0)
MCHC: 31.7 g/dL (ref 30.0–36.0)
MCV: 91.9 fL (ref 78.0–100.0)
MONO ABS: 0.4 10*3/uL (ref 0.1–1.0)
MONOS PCT: 9 % (ref 3–12)
Neutro Abs: 2.2 10*3/uL (ref 1.7–7.7)
Neutrophils Relative %: 56 % (ref 43–77)
Platelets: 298 10*3/uL (ref 150–400)
RBC: 2.85 MIL/uL — AB (ref 3.87–5.11)
RDW: 16.2 % — ABNORMAL HIGH (ref 11.5–15.5)
WBC: 4 10*3/uL (ref 4.0–10.5)

## 2013-12-10 LAB — BASIC METABOLIC PANEL
BUN: 19 mg/dL (ref 6–23)
CO2: 26 meq/L (ref 19–32)
Calcium: 8.7 mg/dL (ref 8.4–10.5)
Chloride: 108 mEq/L (ref 96–112)
Creatinine, Ser: 0.75 mg/dL (ref 0.50–1.10)
GFR calc Af Amer: 86 mL/min — ABNORMAL LOW (ref >90)
GFR calc non Af Amer: 74 mL/min — ABNORMAL LOW (ref >90)
GLUCOSE: 88 mg/dL (ref 70–99)
Potassium: 4.2 mEq/L (ref 3.7–5.3)
Sodium: 143 mEq/L (ref 137–147)

## 2013-12-10 LAB — TYPE AND SCREEN
ABO/RH(D): O POS
ANTIBODY SCREEN: NEGATIVE

## 2013-12-10 LAB — ABO/RH: ABO/RH(D): O POS

## 2013-12-10 NOTE — Discharge Instructions (Signed)
Your Hemoglobin is stable around 8.3.   Recheck hemoglobin in 2 weeks.   Follow up with your doctor.   Return to ER if you have bleeding, black stools, chest pain, shortness of breath, hemoglobin less than 7.

## 2013-12-10 NOTE — ED Notes (Signed)
Bed: [id number] Expected date:  Expected time:  Means of arrival:  Comments: Head lac

## 2013-12-10 NOTE — ED Notes (Signed)
Per EMS- [facility]. Patient was sent by the facility for a Hgb-7.4.

## 2013-12-10 NOTE — ED Notes (Signed)
[facility] called for transportation [facility]

## 2013-12-10 NOTE — ED Provider Notes (Signed)
CSN: [id number]     Arrival date & time 12/10/13  [id number] History   First MD Initiated Contact with Patient 12/10/13 [phone]     Chief Complaint  Patient presents with  . Abnormal Lab   (Consider location/radiation/quality/duration/timing/severity/associated sxs/prior Treatment) The history is provided by the patient and the EMS personnel.  Providence LaniusVivian E [place] is a 80 y.o. female hx of aortic stenosis, recent L femur fracture s/p surgery here with anemia. Was found to be anemic to 7.5 last week. Came to ER and repeat Hg was 8.9, around baseline. Was to repeat Hg in several days and the repeat yesterday was 7.5 again. Denies melena, vomiting, or chest pain or shortness of breath or abdominal pain.    Past Medical History  Diagnosis Date  . Severe aortic stenosis     a. 10/2013 Echo: EF 65-70%, mild conc LVH, Gr 1 DD, sev AS, mild to mod AI, Valve area: 0.89cm^2 (VTI), 0.85cm^2 (Vmax), mildly dil LA, mildly dil RA, PASP 50mHg.  . Osteoarthritis     a. 04/2005 s/p L total hip arthroplasty  . Osteopenia   . Femur fracture, left     a. 10/2013 Acute oblique Fx through te midshaft of the left femur w/ anteromedial displacement, medial angulation, and ~ 10 cm foreshortening/overlap.  Marland Kitchen. Hypertension   . Hyperlipidemia    Past Surgical History  Procedure Laterality Date  . Abdominal hysterectomy    . Total hip arthroplasty Bilateral   . Orif femur fracture Left 11/22/2013  . Orif femur fracture Left 11/22/2013    Procedure: OPEN REDUCTION INTERNAL FIXATION (ORIF) DISTAL FEMUR FRACTURE;  Surgeon: Eldred MangesMark C Yates, MD;  Location: [facility] OR;  Service: Orthopedics;  Laterality: Left;   Family History  Problem Relation Age of Onset  . Heart attack Mother     MI in her 8740's  . Stroke Father     died in his 7990's.   History  Substance Use Topics  . Smoking status: Never Smoker   . Smokeless tobacco: Never Used  . Alcohol Use: No   OB History   Grav Para Term Preterm Abortions TAB SAB Ect Mult Living            Review of Systems  Respiratory: Negative for shortness of breath.   Cardiovascular: Negative for chest pain.  Neurological: Negative for light-headedness.  All other systems reviewed and are negative.    Allergies  Review of patient's allergies indicates no known allergies.  Home Medications   Current Outpatient Rx  Name  Route  Sig  Dispense  Refill  . aspirin EC 325 MG EC tablet   Oral   Take 1 tablet (325 mg total) by mouth daily with breakfast.   30 tablet   0   . cholecalciferol (VITAMIN D) 400 UNITS TABS tablet   Oral   Take 400 Units by mouth.         Marland Kitchen. HYDROcodone-acetaminophen (NORCO) 5-325 MG per tablet   Oral   Take 2 tablets by mouth every 4 (four) hours as needed for moderate pain or severe pain.   40 tablet   0   . lisinopril (PRINIVIL,ZESTRIL) 20 MG tablet   Oral   Take 1 tablet (20 mg total) by mouth daily.         Marland Kitchen. lovastatin (MEVACOR) 40 MG tablet   Oral   Take 40 mg by mouth daily.          . metoprolol (LOPRESSOR) 50 MG tablet  Oral   Take 1 tablet (50 mg total) by mouth 2 (two) times daily.         Marland Kitchen PARoxetine (PAXIL) 20 MG tablet   Oral   Take 20 mg by mouth daily.         . polyethylene glycol (MIRALAX / GLYCOLAX) packet   Oral   Take 17 g by mouth daily as needed for mild constipation.   14 each   0   . risperiDONE (RISPERDAL) 0.25 MG tablet   Oral   Take 0.25 mg by mouth at bedtime.         . risperiDONE (RISPERDAL) 0.5 MG tablet   Oral   Take 0.5 mg by mouth at bedtime.         Marland Kitchen tuberculin 5 UNIT/0.1ML injection   Intradermal   Inject 0.1 mLs into the skin once.          BP 158/62  Pulse 65  Temp(Src) 98.7 F (37.1 C) (Oral)  Resp 16  SpO2 99% Physical Exam  Nursing note and vitals reviewed. Constitutional: She is oriented to person, place, and time.  Chronically ill, NAD   HENT:  Head: Normocephalic.  Mouth/Throat: Oropharynx is clear and moist.  Eyes: Pupils are equal, round, and  reactive to light.  Conjunctiva slightly pale   Neck: Normal range of motion. Neck supple.  Cardiovascular: Normal rate and regular rhythm.   2/6 systolic murmur (hx of aortic stenosis)  Pulmonary/Chest: Effort normal and breath sounds normal. No respiratory distress. She has no wheezes. She has no rales.  Abdominal: Soft. Bowel sounds are normal. She exhibits no distension. There is no tenderness. There is no rebound and no guarding.  Musculoskeletal: Normal range of motion.  Neurological: She is alert and oriented to person, place, and time. No cranial nerve deficit. Coordination normal.  Skin: Skin is warm and dry.  Psychiatric: She has a normal mood and affect. Her behavior is normal. Judgment and thought content normal.    ED Course  Procedures (including critical care time) Labs Review Labs Reviewed  CBC WITH DIFFERENTIAL - Abnormal; Notable for the following:    RBC 2.85 (*)    Hemoglobin 8.3 (*)    HCT 26.2 (*)    RDW 16.2 (*)    Eosinophils Relative 8 (*)    All other components within normal limits  BASIC METABOLIC PANEL - Abnormal; Notable for the following:    GFR calc non Af Amer 74 (*)    GFR calc Af Amer 86 (*)    All other components within normal limits  TYPE AND SCREEN  ABO/RH   Imaging Review No results found.  EKG Interpretation   None       MDM  No diagnosis found. JANELIZ PRESTWOOD is a 80 y.o. female here with possible anemia. Will recheck hemoglobin.   11:46 AM Hg stable from several days ago. BUN and Cr nl so I doubt GI bleed. No need for transfusion. Recheck Hg in 2 weeks or return if evidence of bleeding.      Richardean Canal, MD 12/10/13 1147

## 2013-12-10 NOTE — ED Notes (Signed)
Report called to Joni Reiningicole at [facility] and informed that the patient was returning via EMS.

## 2013-12-12 ENCOUNTER — Emergency Department (HOSPITAL_COMMUNITY)
Admission: EM | Admit: 2013-12-12 | Discharge: 2013-12-12 | Disposition: A | Discharge status: Home / Self Care | Payer: Medicare Other | Attending: Emergency Medicine | Admitting: Emergency Medicine

## 2013-12-12 ENCOUNTER — Encounter (HOSPITAL_COMMUNITY): Payer: Self-pay | Admitting: Emergency Medicine

## 2013-12-12 DIAGNOSIS — I359 Nonrheumatic aortic valve disorder, unspecified: Secondary | ICD-10-CM | POA: Insufficient documentation

## 2013-12-12 DIAGNOSIS — M899 Disorder of bone, unspecified: Secondary | ICD-10-CM | POA: Insufficient documentation

## 2013-12-12 DIAGNOSIS — M1990 Unspecified osteoarthritis, unspecified site: Secondary | ICD-10-CM | POA: Insufficient documentation

## 2013-12-12 DIAGNOSIS — Z7982 Long term (current) use of aspirin: Secondary | ICD-10-CM | POA: Insufficient documentation

## 2013-12-12 DIAGNOSIS — M949 Disorder of cartilage, unspecified: Secondary | ICD-10-CM

## 2013-12-12 DIAGNOSIS — Z79899 Other long term (current) drug therapy: Secondary | ICD-10-CM | POA: Insufficient documentation

## 2013-12-12 DIAGNOSIS — I10 Essential (primary) hypertension: Secondary | ICD-10-CM | POA: Insufficient documentation

## 2013-12-12 DIAGNOSIS — R112 Nausea with vomiting, unspecified: Secondary | ICD-10-CM | POA: Insufficient documentation

## 2013-12-12 DIAGNOSIS — E785 Hyperlipidemia, unspecified: Secondary | ICD-10-CM | POA: Insufficient documentation

## 2013-12-12 LAB — CBC WITH DIFFERENTIAL/PLATELET
Basophils Absolute: 0 10*3/uL (ref 0.0–0.1)
Basophils Relative: 0 % (ref 0–1)
Eosinophils Absolute: 0 10*3/uL (ref 0.0–0.7)
Eosinophils Relative: 1 % (ref 0–5)
HCT: 27.7 % — ABNORMAL LOW (ref 36.0–46.0)
HEMOGLOBIN: 8.8 g/dL — AB (ref 12.0–15.0)
LYMPHS ABS: 0.7 10*3/uL (ref 0.7–4.0)
LYMPHS PCT: 12 % (ref 12–46)
MCH: 29.2 pg (ref 26.0–34.0)
MCHC: 31.8 g/dL (ref 30.0–36.0)
MCV: 92 fL (ref 78.0–100.0)
MONOS PCT: 5 % (ref 3–12)
Monocytes Absolute: 0.3 10*3/uL (ref 0.1–1.0)
NEUTROS PCT: 82 % — AB (ref 43–77)
Neutro Abs: 4.8 10*3/uL (ref 1.7–7.7)
PLATELETS: 248 10*3/uL (ref 150–400)
RBC: 3.01 MIL/uL — ABNORMAL LOW (ref 3.87–5.11)
RDW: 16 % — ABNORMAL HIGH (ref 11.5–15.5)
WBC: 5.8 10*3/uL (ref 4.0–10.5)

## 2013-12-12 LAB — COMPREHENSIVE METABOLIC PANEL
ALK PHOS: 165 U/L — AB (ref 39–117)
ALT: 13 U/L (ref 0–35)
AST: 19 U/L (ref 0–37)
Albumin: 3 g/dL — ABNORMAL LOW (ref 3.5–5.2)
BUN: 13 mg/dL (ref 6–23)
CO2: 24 meq/L (ref 19–32)
Calcium: 9.4 mg/dL (ref 8.4–10.5)
Chloride: 106 mEq/L (ref 96–112)
Creatinine, Ser: 0.65 mg/dL (ref 0.50–1.10)
GFR calc non Af Amer: 78 mL/min — ABNORMAL LOW (ref >90)
GFR, EST AFRICAN AMERICAN: 90 mL/min — AB (ref >90)
GLUCOSE: 122 mg/dL — AB (ref 70–99)
POTASSIUM: 4.1 meq/L (ref 3.7–5.3)
Sodium: 143 mEq/L (ref 137–147)
Total Bilirubin: 0.6 mg/dL (ref 0.3–1.2)
Total Protein: 7.2 g/dL (ref 6.0–8.3)

## 2013-12-12 LAB — LIPASE, BLOOD: LIPASE: 12 U/L (ref 11–59)

## 2013-12-12 LAB — TROPONIN I: Troponin I: <0.3 ng/mL (ref <0.30)

## 2013-12-12 MED ORDER — ONDANSETRON HCL 4 MG/2ML IJ SOLN
4.0000 mg | Freq: Once | INTRAMUSCULAR | Status: AC
  Administered 2013-12-12: 4 mg via INTRAVENOUS
  Filled 2013-12-12: qty 2

## 2013-12-12 MED ORDER — SODIUM CHLORIDE 0.9 % IV BOLUS (SEPSIS)
500.0000 mL | Freq: Once | INTRAVENOUS | Status: AC
  Administered 2013-12-12: 500 mL via INTRAVENOUS

## 2013-12-12 MED ORDER — ONDANSETRON 8 MG PO TBDP: 8.0000 mg | ORAL_TABLET | Freq: Three times a day (TID) | ORAL | Status: DC | PRN

## 2013-12-12 NOTE — ED Notes (Signed)
Report called to Nursing home, Informed of AMA and elevated BP.

## 2013-12-12 NOTE — ED Notes (Signed)
Pt states she would like to return to facility, Pt informed of High BP, Dr Rubin PayorPickering informed

## 2013-12-12 NOTE — Discharge Instructions (Signed)
Hypertension As your heart beats, it forces blood through your arteries. This force is your blood pressure. If the pressure is too high, it is called hypertension (HTN) or high blood pressure. HTN is dangerous because you may have it and not know it. High blood pressure may mean that your heart has to work harder to pump blood. Your arteries may be narrow or stiff. The extra work puts you at risk for heart disease, stroke, and other problems.  Blood pressure consists of two numbers, a higher number over a lower, 110/72, for example. It is stated as "110 over 72." The ideal is below 120 for the top number (systolic) and under 80 for the bottom (diastolic). Write down your blood pressure today. You should pay close attention to your blood pressure if you have certain conditions such as:  Heart failure.  Prior heart attack.  Diabetes  Chronic kidney disease.  Prior stroke.  Multiple risk factors for heart disease. To see if you have HTN, your blood pressure should be measured while you are seated with your arm held at the level of the heart. It should be measured at least twice. A one-time elevated blood pressure reading (especially in the Emergency Department) does not mean that you need treatment. There may be conditions in which the blood pressure is different between your right and left arms. It is important to see your caregiver soon for a recheck. Most people have essential hypertension which means that there is not a specific cause. This type of high blood pressure may be lowered by changing lifestyle factors such as:  Stress.  Smoking.  Lack of exercise.  Excessive weight.  Drug/tobacco/alcohol use.  Eating less salt. Most people do not have symptoms from high blood pressure until it has caused damage to the body. Effective treatment can often prevent, delay or reduce that damage. TREATMENT  When a cause has been identified, treatment for high blood pressure is directed at the  cause. There are a large number of medications to treat HTN. These fall into several categories, and your caregiver will help you select the medicines that are best for you. Medications may have side effects. You should review side effects with your caregiver. If your blood pressure stays high after you have made lifestyle changes or started on medicines,   Your medication(s) may need to be changed.  Other problems may need to be addressed.  Be certain you understand your prescriptions, and know how and when to take your medicine.  Be sure to follow up with your caregiver within the time frame advised (usually within two weeks) to have your blood pressure rechecked and to review your medications.  If you are taking more than one medicine to lower your blood pressure, make sure you know how and at what times they should be taken. Taking two medicines at the same time can result in blood pressure that is too low. SEEK IMMEDIATE MEDICAL CARE IF:  You develop a severe headache, blurred or changing vision, or confusion.  You have unusual weakness or numbness, or a faint feeling.  You have severe chest or abdominal pain, vomiting, or breathing problems. MAKE SURE YOU:   Understand these instructions.  Will watch your condition.  Will get help right away if you are not doing well or get worse. Document Released: 11/14/2005 Document Revised: 02/06/2012 Document Reviewed: 07/04/2008 [facility] Patient Information 2014 [place], [place].  Nausea and Vomiting Nausea is a sick feeling that often comes before throwing up (vomiting).  Vomiting is a reflex where stomach contents come out of your mouth. Vomiting can cause severe loss of body fluids (dehydration). Children and elderly adults can become dehydrated quickly, especially if they also have diarrhea. Nausea and vomiting are symptoms of a condition or disease. It is important to find the cause of your symptoms. CAUSES   Direct irritation of the  stomach lining. This irritation can result from increased acid production (gastroesophageal reflux disease), infection, food poisoning, taking certain medicines (such as nonsteroidal anti-inflammatory drugs), alcohol use, or tobacco use.  Signals from the brain.These signals could be caused by a headache, heat exposure, an inner ear disturbance, increased pressure in the brain from injury, infection, a tumor, or a concussion, pain, emotional stimulus, or metabolic problems.  An obstruction in the gastrointestinal tract (bowel obstruction).  Illnesses such as diabetes, hepatitis, gallbladder problems, appendicitis, kidney problems, cancer, sepsis, atypical symptoms of a heart attack, or eating disorders.  Medical treatments such as chemotherapy and radiation.  Receiving medicine that makes you sleep (general anesthetic) during surgery. DIAGNOSIS Your caregiver may ask for tests to be done if the problems do not improve after a few days. Tests may also be done if symptoms are severe or if the reason for the nausea and vomiting is not clear. Tests may include:  Urine tests.  Blood tests.  Stool tests.  Cultures (to look for evidence of infection).  X-rays or other imaging studies. Test results can help your caregiver make decisions about treatment or the need for additional tests. TREATMENT You need to stay well hydrated. Drink frequently but in small amounts.You may wish to drink water, sports drinks, clear broth, or eat frozen ice pops or gelatin dessert to help stay hydrated.When you eat, eating slowly may help prevent nausea.There are also some antinausea medicines that may help prevent nausea. HOME CARE INSTRUCTIONS   Take all medicine as directed by your caregiver.  If you do not have an appetite, do not force yourself to eat. However, you must continue to drink fluids.  If you have an appetite, eat a normal diet unless your caregiver tells you differently.  Eat a variety of  complex carbohydrates (rice, wheat, potatoes, bread), lean meats, yogurt, fruits, and vegetables.  Avoid high-fat foods because they are more difficult to digest.  Drink enough water and fluids to keep your urine clear or pale yellow.  If you are dehydrated, ask your caregiver for specific rehydration instructions. Signs of dehydration may include:  Severe thirst.  Dry lips and mouth.  Dizziness.  Dark urine.  Decreasing urine frequency and amount.  Confusion.  Rapid breathing or pulse. SEEK IMMEDIATE MEDICAL CARE IF:   You have blood or brown flecks (like coffee grounds) in your vomit.  You have black or bloody stools.  You have a severe headache or stiff neck.  You are confused.  You have severe abdominal pain.  You have chest pain or trouble breathing.  You do not urinate at least once every 8 hours.  You develop cold or clammy skin.  You continue to vomit for longer than 24 to 48 hours.  You have a fever. MAKE SURE YOU:   Understand these instructions.  Will watch your condition.  Will get help right away if you are not doing well or get worse. Document Released: 11/14/2005 Document Revised: 02/06/2012 Document Reviewed: 04/13/2011 [facility] Patient Information 2014 [place].

## 2013-12-12 NOTE — ED Notes (Signed)
EDP at bedside to perform evaluation. Pt states that she is having nausea and vomiting, pt was hurting when EDP pressed on mid lower abdomen. Pt states that she was sent out twice over the last couple of days to have blood tranfusions but her hemoglobin was above 8 at each facility.

## 2013-12-12 NOTE — ED Notes (Signed)
Pt wants to go back to facility BO still elevated.

## 2013-12-12 NOTE — ED Notes (Signed)
Pt informed of Leaving AMA. Pt had disconnected self from monitor.

## 2013-12-12 NOTE — ED Notes (Signed)
Per EMS: [facility]. Pt was feeling nauseated and unable to eat for the past couple of days. Pt states that she has vomited 3-4 times this morning and cannot get anything else up but is still having dry heaves. Pt burping as well. Pt alert and oriented.

## 2013-12-12 NOTE — ED Provider Notes (Signed)
CSN: [id number]     Arrival date & time 12/12/13  4098 History   First MD Initiated Contact with Patient 12/12/13 0700     Chief Complaint  Patient presents with  . Nausea  . Abdominal Pain   (Consider location/radiation/quality/duration/timing/severity/associated sxs/prior Treatment) Patient is a 79 y.o. female presenting with abdominal pain. The history is provided by the patient.  Abdominal Pain Associated symptoms: nausea and vomiting   Associated symptoms: no chest pain, no diarrhea and no shortness of breath    patient presents with nausea vomiting abdominal pain. She states she still having bowel movements. She's felt bad for last couple days. No fevers. She's had a good appetite, but states everything is going to come right back up. No fevers. No chills. No headache. No confusion.  Past Medical History  Diagnosis Date  . Severe aortic stenosis     a. 10/2013 Echo: EF 65-70%, mild conc LVH, Gr 1 DD, sev AS, mild to mod AI, Valve area: 0.89cm^2 (VTI), 0.85cm^2 (Vmax), mildly dil LA, mildly dil RA, PASP .  . Osteoarthritis     a. 04/2005 s/p L total hip arthroplasty  . Osteopenia   . Femur fracture, left     a. 10/2013 Acute oblique Fx through te midshaft of the left femur w/ anteromedial displacement, medial angulation, and ~ 10 cm foreshortening/overlap.  Marland Kitchen Hypertension   . Hyperlipidemia    Past Surgical History  Procedure Laterality Date  . Abdominal hysterectomy    . Total hip arthroplasty Bilateral   . Orif femur fracture Left 11/22/2013  . Orif femur fracture Left 11/22/2013    Procedure: OPEN REDUCTION INTERNAL FIXATION (ORIF) DISTAL FEMUR FRACTURE;  Surgeon: Eldred Manges, MD;  Location: [facility] OR;  Service: Orthopedics;  Laterality: Left;   Family History  Problem Relation Age of Onset  . Heart attack Mother     MI in her 70's  . Stroke Father     died in his 77's.   History  Substance Use Topics  . Smoking status: Never Smoker   . Smokeless tobacco: Never  Used  . Alcohol Use: No   OB History   Grav Para Term Preterm Abortions TAB SAB Ect Mult Living                 Review of Systems  Constitutional: Negative for activity change and appetite change.  Eyes: Negative for pain.  Respiratory: Negative for chest tightness and shortness of breath.   Cardiovascular: Negative for chest pain and leg swelling.  Gastrointestinal: Positive for nausea, vomiting and abdominal pain. Negative for diarrhea.  Genitourinary: Negative for flank pain.  Musculoskeletal: Negative for back pain and neck stiffness.  Skin: Negative for rash.  Neurological: Negative for weakness, numbness and headaches.  Psychiatric/Behavioral: Negative for behavioral problems.    Allergies  Review of patient's allergies indicates no known allergies.  Home Medications   Current Outpatient Rx  Name  Route  Sig  Dispense  Refill  . aspirin EC 325 MG EC tablet   Oral   Take 1 tablet (325 mg total) by mouth daily with breakfast.   30 tablet   0   . lisinopril (PRINIVIL,ZESTRIL) 20 MG tablet   Oral   Take 1 tablet (20 mg total) by mouth daily.         Marland Kitchen lovastatin (MEVACOR) 40 MG tablet   Oral   Take 40 mg by mouth daily.          . metoprolol (LOPRESSOR)  50 MG tablet   Oral   Take 1 tablet (50 mg total) by mouth 2 (two) times daily.         Marland Kitchen. PARoxetine (PAXIL) 20 MG tablet   Oral   Take 20 mg by mouth daily.         . risperiDONE (RISPERDAL) 0.5 MG tablet   Oral   Take 0.5 mg by mouth at bedtime.         Marland Kitchen. HYDROcodone-acetaminophen (NORCO) 5-325 MG per tablet   Oral   Take 2 tablets by mouth every 4 (four) hours as needed for moderate pain or severe pain.   40 tablet   0   . ondansetron (ZOFRAN-ODT) 8 MG disintegrating tablet   Oral   Take 1 tablet (8 mg total) by mouth every 8 (eight) hours as needed for nausea or vomiting.   10 tablet   0   . polyethylene glycol (MIRALAX / GLYCOLAX) packet   Oral   Take 17 g by mouth daily as needed  for mild constipation.   14 each   0    BP 209/76  Pulse 85  Temp(Src) 97.8 F (36.6 C) (Oral)  Resp 18  SpO2 94% Physical Exam  Nursing note and vitals reviewed. Constitutional: She is oriented to person, place, and time. She appears well-developed and well-nourished.  HENT:  Head: Normocephalic and atraumatic.  Eyes: EOM are normal. Pupils are equal, round, and reactive to light.  Neck: Normal range of motion. Neck supple.  Cardiovascular: Normal rate, regular rhythm and normal heart sounds.   No murmur heard. Pulmonary/Chest: Effort normal and breath sounds normal. No respiratory distress. She has no wheezes. She has no rales.  Abdominal: Soft. Bowel sounds are normal. She exhibits no distension. There is tenderness. There is no rebound and no guarding.  No distention. No hernias palpated. Mild is diffuse tenderness.  Musculoskeletal: Normal range of motion.  Neurological: She is alert and oriented to person, place, and time. No cranial nerve deficit.  Skin: Skin is warm and dry.  Psychiatric: She has a normal mood and affect. Her speech is normal.    ED Course  Procedures (including critical care time) Labs Review Labs Reviewed  CBC WITH DIFFERENTIAL - Abnormal; Notable for the following:    RBC 3.01 (*)    Hemoglobin 8.8 (*)    HCT 27.7 (*)    RDW 16.0 (*)    Neutrophils Relative % 82 (*)    All other components within normal limits  COMPREHENSIVE METABOLIC PANEL - Abnormal; Notable for the following:    Glucose, Bld 122 (*)    Albumin 3.0 (*)    Alkaline Phosphatase 165 (*)    GFR calc non Af Amer 78 (*)    GFR calc Af Amer 90 (*)    All other components within normal limits  LIPASE, BLOOD  TROPONIN I   Imaging Review No results found.  EKG Interpretation    Date/Time:  Thursday December 12 2013 07:17:37 EST Ventricular Rate:  73 PR Interval:  163 QRS Duration: 95 QT Interval:  431 QTC Calculation: 475 R Axis:   23 Text Interpretation:  Sinus rhythm   T waves more prominant in V2 and II Confirmed by Takesha Steger  MD, Ameya Kutz ([id number]) on 12/12/2013 3:53:57 PM            MDM   1. Nausea and vomiting   2. HTN (hypertension)    Patient with nausea vomiting and hypertension. Patient feels somewhat better  after Zofran. She's not willing to wait for further evaluation. States she's aware rest. She does have hypertension, is unsure if you take her medicines this morning. No chest pain. She will be discharged home    Juliet Rude. Rubin Payor, MD 12/12/13 [phone]

## 2013-12-12 NOTE — ED Notes (Addendum)
Pt stated zofran only improved nausea slightly.  Continues to request food and beverage.  Dr Rubin PayorPickering stated to give pt po challenge.  Pt given apple juice and crackers and stated no relief.  2nd zofran to be given. Pt continues to be hypertensive.

## 2013-12-12 NOTE — ED Notes (Signed)
D/c I/v 

## 2014-01-26 NOTE — Progress Notes (Signed)
Clinical social worker assisted with patient discharge to skilled nursing facility, [facility].  CSW addressed all family questions and concerns. CSW copied chart and added all important documents. CSW also set up patient transportation with [facility]. Clinical Social Worker will sign off for now as social work intervention is no longer needed.   Deirdra Heumann, MSW, LCSWA [phone]  

## 2015-03-23 DIAGNOSIS — E782 Mixed hyperlipidemia: Secondary | ICD-10-CM | POA: Diagnosis not present

## 2015-03-23 DIAGNOSIS — N3941 Urge incontinence: Secondary | ICD-10-CM | POA: Diagnosis not present

## 2015-03-23 DIAGNOSIS — Z0000 Encounter for general adult medical examination without abnormal findings: Secondary | ICD-10-CM | POA: Diagnosis not present

## 2015-03-23 DIAGNOSIS — I10 Essential (primary) hypertension: Secondary | ICD-10-CM | POA: Diagnosis not present

## 2015-05-25 DIAGNOSIS — E782 Mixed hyperlipidemia: Secondary | ICD-10-CM | POA: Diagnosis not present

## 2015-05-25 DIAGNOSIS — M5136 Other intervertebral disc degeneration, lumbar region: Secondary | ICD-10-CM | POA: Diagnosis not present

## 2015-05-25 DIAGNOSIS — I10 Essential (primary) hypertension: Secondary | ICD-10-CM | POA: Diagnosis not present

## 2015-07-08 DIAGNOSIS — R0789 Other chest pain: Secondary | ICD-10-CM | POA: Diagnosis not present

## 2015-07-08 DIAGNOSIS — R1110 Vomiting, unspecified: Secondary | ICD-10-CM | POA: Diagnosis not present

## 2015-11-02 DIAGNOSIS — E782 Mixed hyperlipidemia: Secondary | ICD-10-CM | POA: Diagnosis not present

## 2015-11-02 DIAGNOSIS — I10 Essential (primary) hypertension: Secondary | ICD-10-CM | POA: Diagnosis not present

## 2016-02-02 DIAGNOSIS — E782 Mixed hyperlipidemia: Secondary | ICD-10-CM | POA: Diagnosis not present

## 2016-02-02 DIAGNOSIS — D696 Thrombocytopenia, unspecified: Secondary | ICD-10-CM | POA: Diagnosis not present

## 2016-02-02 DIAGNOSIS — I10 Essential (primary) hypertension: Secondary | ICD-10-CM | POA: Diagnosis not present

## 2016-07-19 DIAGNOSIS — D696 Thrombocytopenia, unspecified: Secondary | ICD-10-CM | POA: Diagnosis not present

## 2016-07-19 DIAGNOSIS — I10 Essential (primary) hypertension: Secondary | ICD-10-CM | POA: Diagnosis not present

## 2016-07-19 DIAGNOSIS — Z23 Encounter for immunization: Secondary | ICD-10-CM | POA: Diagnosis not present

## 2016-07-19 DIAGNOSIS — Z0000 Encounter for general adult medical examination without abnormal findings: Secondary | ICD-10-CM | POA: Diagnosis not present

## 2016-07-19 DIAGNOSIS — E782 Mixed hyperlipidemia: Secondary | ICD-10-CM | POA: Diagnosis not present

## 2016-07-26 DIAGNOSIS — D696 Thrombocytopenia, unspecified: Secondary | ICD-10-CM | POA: Diagnosis not present

## 2016-07-26 DIAGNOSIS — E782 Mixed hyperlipidemia: Secondary | ICD-10-CM | POA: Diagnosis not present

## 2016-07-26 DIAGNOSIS — Z780 Asymptomatic menopausal state: Secondary | ICD-10-CM | POA: Diagnosis not present

## 2016-07-26 DIAGNOSIS — I359 Nonrheumatic aortic valve disorder, unspecified: Secondary | ICD-10-CM | POA: Diagnosis not present

## 2016-08-15 DIAGNOSIS — I10 Essential (primary) hypertension: Secondary | ICD-10-CM | POA: Diagnosis not present

## 2017-01-30 DIAGNOSIS — D696 Thrombocytopenia, unspecified: Secondary | ICD-10-CM | POA: Diagnosis not present

## 2017-01-30 DIAGNOSIS — E782 Mixed hyperlipidemia: Secondary | ICD-10-CM | POA: Diagnosis not present

## 2017-02-06 DIAGNOSIS — I10 Essential (primary) hypertension: Secondary | ICD-10-CM | POA: Diagnosis not present

## 2017-02-06 DIAGNOSIS — M150 Primary generalized (osteo)arthritis: Secondary | ICD-10-CM | POA: Diagnosis not present

## 2017-02-06 DIAGNOSIS — E782 Mixed hyperlipidemia: Secondary | ICD-10-CM | POA: Diagnosis not present

## 2017-04-21 DIAGNOSIS — H26492 Other secondary cataract, left eye: Secondary | ICD-10-CM | POA: Diagnosis not present

## 2017-04-21 DIAGNOSIS — H25811 Combined forms of age-related cataract, right eye: Secondary | ICD-10-CM | POA: Diagnosis not present

## 2017-04-21 DIAGNOSIS — H04123 Dry eye syndrome of bilateral lacrimal glands: Secondary | ICD-10-CM | POA: Diagnosis not present

## 2017-04-21 DIAGNOSIS — H2511 Age-related nuclear cataract, right eye: Secondary | ICD-10-CM | POA: Diagnosis not present

## 2017-04-21 DIAGNOSIS — H25041 Posterior subcapsular polar age-related cataract, right eye: Secondary | ICD-10-CM | POA: Diagnosis not present

## 2017-05-16 DIAGNOSIS — Z961 Presence of intraocular lens: Secondary | ICD-10-CM | POA: Diagnosis not present

## 2017-05-16 DIAGNOSIS — H26492 Other secondary cataract, left eye: Secondary | ICD-10-CM | POA: Diagnosis not present

## 2017-05-16 DIAGNOSIS — H25811 Combined forms of age-related cataract, right eye: Secondary | ICD-10-CM | POA: Diagnosis not present

## 2017-06-05 DIAGNOSIS — H2511 Age-related nuclear cataract, right eye: Secondary | ICD-10-CM | POA: Diagnosis not present

## 2017-06-08 DIAGNOSIS — H268 Other specified cataract: Secondary | ICD-10-CM | POA: Diagnosis not present

## 2017-06-08 DIAGNOSIS — H2511 Age-related nuclear cataract, right eye: Secondary | ICD-10-CM | POA: Diagnosis not present

## 2017-08-28 DIAGNOSIS — N390 Urinary tract infection, site not specified: Secondary | ICD-10-CM | POA: Diagnosis not present

## 2017-08-28 DIAGNOSIS — Z0000 Encounter for general adult medical examination without abnormal findings: Secondary | ICD-10-CM | POA: Diagnosis not present

## 2017-08-28 DIAGNOSIS — E782 Mixed hyperlipidemia: Secondary | ICD-10-CM | POA: Diagnosis not present

## 2017-08-28 DIAGNOSIS — Z23 Encounter for immunization: Secondary | ICD-10-CM | POA: Diagnosis not present

## 2017-08-28 DIAGNOSIS — I10 Essential (primary) hypertension: Secondary | ICD-10-CM | POA: Diagnosis not present

## 2017-09-04 DIAGNOSIS — E782 Mixed hyperlipidemia: Secondary | ICD-10-CM | POA: Diagnosis not present

## 2017-09-04 DIAGNOSIS — M5136 Other intervertebral disc degeneration, lumbar region: Secondary | ICD-10-CM | POA: Diagnosis not present

## 2017-09-04 DIAGNOSIS — I129 Hypertensive chronic kidney disease with stage 1 through stage 4 chronic kidney disease, or unspecified chronic kidney disease: Secondary | ICD-10-CM | POA: Diagnosis not present

## 2017-09-04 DIAGNOSIS — N183 Chronic kidney disease, stage 3 (moderate): Secondary | ICD-10-CM | POA: Diagnosis not present

## 2017-09-04 DIAGNOSIS — M150 Primary generalized (osteo)arthritis: Secondary | ICD-10-CM | POA: Diagnosis not present

## 2017-09-14 DIAGNOSIS — I359 Nonrheumatic aortic valve disorder, unspecified: Secondary | ICD-10-CM | POA: Diagnosis not present

## 2018-01-01 DIAGNOSIS — N183 Chronic kidney disease, stage 3 (moderate): Secondary | ICD-10-CM | POA: Diagnosis not present

## 2018-01-01 DIAGNOSIS — E782 Mixed hyperlipidemia: Secondary | ICD-10-CM | POA: Diagnosis not present

## 2018-01-01 DIAGNOSIS — I129 Hypertensive chronic kidney disease with stage 1 through stage 4 chronic kidney disease, or unspecified chronic kidney disease: Secondary | ICD-10-CM | POA: Diagnosis not present

## 2018-01-08 DIAGNOSIS — I129 Hypertensive chronic kidney disease with stage 1 through stage 4 chronic kidney disease, or unspecified chronic kidney disease: Secondary | ICD-10-CM | POA: Diagnosis not present

## 2018-01-08 DIAGNOSIS — E782 Mixed hyperlipidemia: Secondary | ICD-10-CM | POA: Diagnosis not present

## 2018-01-08 DIAGNOSIS — M150 Primary generalized (osteo)arthritis: Secondary | ICD-10-CM | POA: Diagnosis not present

## 2018-01-08 DIAGNOSIS — I359 Nonrheumatic aortic valve disorder, unspecified: Secondary | ICD-10-CM | POA: Diagnosis not present

## 2018-01-08 DIAGNOSIS — I10 Essential (primary) hypertension: Secondary | ICD-10-CM | POA: Diagnosis not present

## 2018-02-02 ENCOUNTER — Emergency Department (HOSPITAL_COMMUNITY): Payer: Medicare Other

## 2018-02-02 ENCOUNTER — Emergency Department (HOSPITAL_COMMUNITY)
Admission: EM | Admit: 2018-02-02 | Discharge: 2018-02-02 | Disposition: A | Discharge status: Home / Self Care | Payer: Medicare Other | Attending: Emergency Medicine | Admitting: Emergency Medicine

## 2018-02-02 ENCOUNTER — Encounter (HOSPITAL_COMMUNITY): Payer: Self-pay | Admitting: Emergency Medicine

## 2018-02-02 DIAGNOSIS — M25552 Pain in left hip: Secondary | ICD-10-CM | POA: Diagnosis not present

## 2018-02-02 DIAGNOSIS — Z96643 Presence of artificial hip joint, bilateral: Secondary | ICD-10-CM | POA: Insufficient documentation

## 2018-02-02 DIAGNOSIS — Z96642 Presence of left artificial hip joint: Secondary | ICD-10-CM | POA: Diagnosis not present

## 2018-02-02 DIAGNOSIS — Z96641 Presence of right artificial hip joint: Secondary | ICD-10-CM | POA: Diagnosis not present

## 2018-02-02 DIAGNOSIS — Z7982 Long term (current) use of aspirin: Secondary | ICD-10-CM | POA: Insufficient documentation

## 2018-02-02 DIAGNOSIS — I252 Old myocardial infarction: Secondary | ICD-10-CM | POA: Diagnosis not present

## 2018-02-02 DIAGNOSIS — R0781 Pleurodynia: Secondary | ICD-10-CM

## 2018-02-02 DIAGNOSIS — Z79899 Other long term (current) drug therapy: Secondary | ICD-10-CM | POA: Diagnosis not present

## 2018-02-02 DIAGNOSIS — I10 Essential (primary) hypertension: Secondary | ICD-10-CM | POA: Insufficient documentation

## 2018-02-02 DIAGNOSIS — M549 Dorsalgia, unspecified: Secondary | ICD-10-CM | POA: Diagnosis not present

## 2018-02-02 DIAGNOSIS — Z471 Aftercare following joint replacement surgery: Secondary | ICD-10-CM | POA: Diagnosis not present

## 2018-02-02 DIAGNOSIS — M545 Low back pain: Secondary | ICD-10-CM | POA: Diagnosis not present

## 2018-02-02 LAB — BASIC METABOLIC PANEL
ANION GAP: 8 (ref 5–15)
BUN: 23 mg/dL — ABNORMAL HIGH (ref 6–20)
CALCIUM: 9.7 mg/dL (ref 8.9–10.3)
CO2: 29 mmol/L (ref 22–32)
Chloride: 108 mmol/L (ref 101–111)
Creatinine, Ser: 0.92 mg/dL (ref 0.44–1.00)
GFR calc Af Amer: >60 mL/min (ref >60)
GFR, EST NON AFRICAN AMERICAN: 53 mL/min — AB (ref >60)
Glucose, Bld: 82 mg/dL (ref 65–99)
Potassium: 4 mmol/L (ref 3.5–5.1)
SODIUM: 145 mmol/L (ref 135–145)

## 2018-02-02 LAB — CBC WITH DIFFERENTIAL/PLATELET
BASOS ABS: 0 10*3/uL (ref 0.0–0.1)
BASOS PCT: 0 %
Eosinophils Absolute: 0.1 10*3/uL (ref 0.0–0.7)
Eosinophils Relative: 1 %
HCT: 35.7 % — ABNORMAL LOW (ref 36.0–46.0)
HEMOGLOBIN: 11.4 g/dL — AB (ref 12.0–15.0)
Lymphocytes Relative: 29 %
Lymphs Abs: 1.4 10*3/uL (ref 0.7–4.0)
MCH: 31.1 pg (ref 26.0–34.0)
MCHC: 31.9 g/dL (ref 30.0–36.0)
MCV: 97.3 fL (ref 78.0–100.0)
Monocytes Absolute: 0.3 10*3/uL (ref 0.1–1.0)
Monocytes Relative: 6 %
Neutro Abs: 3 10*3/uL (ref 1.7–7.7)
Neutrophils Relative %: 64 %
Platelets: 135 10*3/uL — ABNORMAL LOW (ref 150–400)
RBC: 3.67 MIL/uL — ABNORMAL LOW (ref 3.87–5.11)
RDW: 15.1 % (ref 11.5–15.5)
WBC: 4.7 10*3/uL (ref 4.0–10.5)

## 2018-02-02 LAB — CBG MONITORING, ED: Glucose-Capillary: 79 mg/dL (ref 65–99)

## 2018-02-02 MED ORDER — DOCUSATE SODIUM 100 MG PO CAPS: 100.0000 mg | ORAL_CAPSULE | Freq: Two times a day (BID) | ORAL | 0 refills | Status: DC

## 2018-02-02 MED ORDER — FENTANYL CITRATE (PF) 100 MCG/2ML IJ SOLN
25.0000 ug | Freq: Once | INTRAMUSCULAR | Status: AC
  Administered 2018-02-02: 25 ug via INTRAVENOUS
  Filled 2018-02-02: qty 2

## 2018-02-02 MED ORDER — ONDANSETRON HCL 4 MG/2ML IJ SOLN
4.0000 mg | Freq: Once | INTRAMUSCULAR | Status: AC
  Administered 2018-02-02: 4 mg via INTRAVENOUS
  Filled 2018-02-02: qty 2

## 2018-02-02 MED ORDER — HYDROCODONE-ACETAMINOPHEN 5-325 MG PO TABS: 1.0000 | ORAL_TABLET | Freq: Four times a day (QID) | ORAL | 0 refills | Status: DC | PRN

## 2018-02-02 NOTE — ED Notes (Signed)
Urine clicked off by mistake 

## 2018-02-02 NOTE — ED Provider Notes (Signed)
[facility]   CSN: [id number] Arrival date & time: 02/02/18  1204     History   Chief Complaint Chief Complaint  Patient presents with  . Back Pain    HPI [name] is a 84 y.o. female who presents the emergency department with chief complaint of left hip and gluteal pain.  She is attended here by her daughter the patient and her daughter give the history.  Patient lives by herself and is independent.  Her daughter states that last night she called her complaining of severe left gluteal and low back pain.  The patient states that it began suddenly yesterday.  She rates the pain as severe.  She is able to ambulate however is having significant difficulty.  She states that the pain is sharp.  It does not radiate.  It is constant.  It is located in the left gluteal region.  It is worse with any kind of movement or pressure.  She denies any falls or injuries.  The patient has a history of ORIF of the left hip and femur after fracture in 2014 performed by Dr. Ophelia Charter.  She has not had any problems like this in the past.  She denies fevers, chills, urinary symptoms.  HPI  Past Medical History:  Diagnosis Date  . Femur fracture, left (HCC)    a. 10/2013 Acute oblique Fx through te midshaft of the left femur w/ anteromedial displacement, medial angulation, and ~ 10 cm foreshortening/overlap.  Marland Kitchen Hyperlipidemia   . Hypertension   . Osteoarthritis    a. 04/2005 s/p L total hip arthroplasty  . Osteopenia   . Severe aortic stenosis    a. 10/2013 Echo: EF 65-70%, mild conc LVH, Gr 1 DD, sev AS, mild to mod AI, Valve area: 0.89cm^2 (VTI), 0.85cm^2 (Vmax), mildly dil LA, mildly dil RA, PASP .    Patient Active Problem List   Diagnosis Date Noted  . Acute blood loss anemia 11/22/2013  . Aortic stenosis, severe: PER 2 D ECHO 11/21/13 11/21/2013  . Closed displaced oblique fracture of shaft of left femur (HCC) 11/20/2013  . HTN (hypertension)  11/20/2013  . Systolic murmur 11/20/2013  . Hypercholesterolemia 11/20/2013  . Fracture, femur, shaft (HCC) 11/20/2013    Past Surgical History:  Procedure Laterality Date  . ABDOMINAL HYSTERECTOMY    . ORIF FEMUR FRACTURE Left 11/22/2013  . ORIF FEMUR FRACTURE Left 11/22/2013   Procedure: OPEN REDUCTION INTERNAL FIXATION (ORIF) DISTAL FEMUR FRACTURE;  Surgeon: Eldred Manges, MD;  Location: [facility] OR;  Service: Orthopedics;  Laterality: Left;  . TOTAL HIP ARTHROPLASTY Bilateral     OB History    No data available       Home Medications    Prior to Admission medications   Medication Sig Start Date End Date Taking? Authorizing Provider  aspirin EC 325 MG EC tablet Take 1 tablet (325 mg total) by mouth daily with breakfast. 11/25/13   Maud Deed, PA-C  HYDROcodone-acetaminophen (NORCO) 5-325 MG per tablet Take 2 tablets by mouth every 4 (four) hours as needed for moderate pain or severe pain. 11/22/13   Eldred Manges, MD  lisinopril (PRINIVIL,ZESTRIL) 20 MG tablet Take 1 tablet (20 mg total) by mouth daily. 11/28/13   Viyuoh, Rolland Bimler, MD  lovastatin (MEVACOR) 40 MG tablet Take 40 mg by mouth daily.  10/28/13   [provider]  metoprolol (LOPRESSOR) 50 MG tablet Take 1 tablet (50 mg total) by mouth 2 (two)  times daily. 11/28/13   Viyuoh, Rolland Bimler, MD  ondansetron (ZOFRAN-ODT) 8 MG disintegrating tablet Take 1 tablet (8 mg total) by mouth every 8 (eight) hours as needed for nausea or vomiting. 12/12/13   Benjiman Core, MD  PARoxetine (PAXIL) 20 MG tablet Take 20 mg by mouth daily.    [provider]  polyethylene glycol (MIRALAX / GLYCOLAX) packet Take 17 g by mouth daily as needed for mild constipation. 11/27/13   Viyuoh, Rolland Bimler, MD  risperiDONE (RISPERDAL) 0.5 MG tablet Take 0.5 mg by mouth at bedtime.    [provider]    Family History Family History  Problem Relation Age of Onset  . Heart attack Mother        MI in her 16's  . Stroke Father         died in his 47's.    Social History Social History   Tobacco Use  . Smoking status: Never Smoker  . Smokeless tobacco: Never Used  Substance Use Topics  . Alcohol use: No  . Drug use: No     Allergies   Patient has no known allergies.   Review of Systems Review of Systems  Ten systems reviewed and are negative for acute change, except as noted in the HPI.   Physical Exam Updated Vital Signs BP (!) 162/47 (BP Location: Left Arm)   Pulse (!) 56   Temp 97.6 F (36.4 C) (Oral)   Resp 18   SpO2 100%   Physical Exam  Constitutional: She is oriented to person, place, and time. She appears well-developed and well-nourished. No distress.  HENT:  Head: Normocephalic and atraumatic.  Eyes: Conjunctivae are normal. No scleral icterus.  Neck: Normal range of motion.  Cardiovascular: Normal rate, regular rhythm and normal heart sounds. Exam reveals no gallop and no friction rub.  No murmur heard. Pulmonary/Chest: Effort normal and breath sounds normal. No respiratory distress.  Abdominal: Soft. Bowel sounds are normal. She exhibits no distension and no mass. There is no tenderness. There is no guarding.  Musculoskeletal:  No midline spinal tenderness, tender to palpation along the left posterior trochanter.  Significant muscular atrophy likely age-related protein calorie malnutrition.  Pain with any range of motion of the hip.  Neurological: She is alert and oriented to person, place, and time.  Skin: Skin is warm and dry. She is not diaphoretic.  Psychiatric: Her behavior is normal.  Nursing note and vitals reviewed.    ED Treatments / Results  Labs (all labs ordered are listed, but only abnormal results are displayed) Labs Reviewed  BASIC METABOLIC PANEL - Abnormal; Notable for the following components:      Result Value   BUN 23 (*)    GFR calc non Af Amer 53 (*)    All other components within normal limits  CBC WITH DIFFERENTIAL/PLATELET - Abnormal; Notable for  the following components:   RBC 3.67 (*)    Hemoglobin 11.4 (*)    HCT 35.7 (*)    Platelets 135 (*)    All other components within normal limits  CBG MONITORING, ED    EKG  EKG Interpretation None       Radiology No results found.  Procedures Procedures (including critical care time)  Medications Ordered in ED Medications  fentaNYL (SUBLIMAZE) injection 25 mcg (25 mcg Intravenous Given 02/02/18 1447)  ondansetron (ZOFRAN) injection 4 mg (4 mg Intravenous Given 02/02/18 1447)     Initial Impression / Assessment and Plan / ED Course  I have reviewed the triage vital signs and the nursing notes.  Pertinent labs & imaging results that were available during my care of the patient were reviewed by me and considered in my medical decision making (see chart for details).    Patient labs without significant abnormality.  I have reviewed her imaging there is no evidence of compression fracture, no malalignment of hip arthroplasties.  This may represent a bursitis.  May also represent a sacroiliitis.  Patient has been seen and shared visit with attending physician.  Pain controlled with medications she is ambulatory with her walker and appears appropriate for discharge at this time.  I have called the patient's daughter to update her on the patient's status.   Final Clinical Impressions(s) / ED Diagnoses   Final diagnoses:  Posterior pain of left hip    ED Discharge Orders    None       Arthor CaptainHarris, Darrio Bade, PA-C 02/02/18 2259    Arby BarrettePfeiffer, Marcy, MD 02/03/18 [phone]

## 2018-02-02 NOTE — ED Notes (Signed)
Patient ambulated with walker from bed to to nurse's desk and back bed.

## 2018-02-02 NOTE — ED Triage Notes (Signed)
Patient came in by [facility] from home for lower back pain that started back pain more on left side. Patient had walker at home. CBG 58 not given any meds by EMS. Hx hip replacement on left side. Vitals: 170/82, 63HR, 99% on RA. 18R.

## 2018-02-02 NOTE — ED Notes (Signed)
Patient transported to X-ray 

## 2018-02-02 NOTE — ED Notes (Addendum)
Patient adds that pain in lower back is worse with movement. Denies any injuries or falls.  No bowel or urinary problems.

## 2018-02-02 NOTE — Discharge Instructions (Signed)
Contact a health care provider if: You have a fever. You develop new symptoms. You have difficulty walking or doing everyday activities. You have pain that gets worse or does not get better with medicine. You develop red skin or a feeling of warmth in your hip area. Get help right away if: You cannot move your hip. You have severe pain.

## 2018-02-02 NOTE — ED Provider Notes (Signed)
Medical screening examination/treatment/procedure(s) were conducted as a shared visit with non-physician practitioner(s) and myself.  I personally evaluated the patient during the encounter.   EKG Interpretation None     She reports that she has back pain that started pretty suddenly.  She indicates her left sciatic area and gluteus.  She reports the pain is been really severe.  She lives by herself and independently.  Patient is alert and nontoxic.  Mental status clear.  No respiratory distress.  Abdomen is soft nontender.  Patient clearly indicates area of pain as SI\left sacrum and then left sciatic nerve exit region.  I have seen the patient after initial pain control.  She still reports discomfort but her mobility in the bed is good she is able to twist and rotate without severe pain.  Pain in location has distinctly musculoskeletal quality.  I agree with transitioning to oral pain medications and testing for adequate gait function and return to home.   Arby BarrettePfeiffer, Elmarie Devlin, MD 02/03/18 [phone]

## 2018-02-02 NOTE — ED Notes (Signed)
Called Ms. Hooper [phone]), pt's daughter, to see if able to come pick up patient since discharged. No answer left message on voicemail. Awaiting a call back.

## 2018-03-16 DIAGNOSIS — R2681 Unsteadiness on feet: Secondary | ICD-10-CM | POA: Diagnosis not present

## 2018-03-16 DIAGNOSIS — I350 Nonrheumatic aortic (valve) stenosis: Secondary | ICD-10-CM | POA: Diagnosis not present

## 2018-03-16 DIAGNOSIS — M5126 Other intervertebral disc displacement, lumbar region: Secondary | ICD-10-CM | POA: Diagnosis not present

## 2018-03-16 DIAGNOSIS — I129 Hypertensive chronic kidney disease with stage 1 through stage 4 chronic kidney disease, or unspecified chronic kidney disease: Secondary | ICD-10-CM | POA: Diagnosis not present

## 2018-03-16 DIAGNOSIS — N183 Chronic kidney disease, stage 3 (moderate): Secondary | ICD-10-CM | POA: Diagnosis not present

## 2018-03-16 DIAGNOSIS — M150 Primary generalized (osteo)arthritis: Secondary | ICD-10-CM | POA: Diagnosis not present

## 2018-03-22 DIAGNOSIS — N183 Chronic kidney disease, stage 3 (moderate): Secondary | ICD-10-CM | POA: Diagnosis not present

## 2018-03-22 DIAGNOSIS — M5126 Other intervertebral disc displacement, lumbar region: Secondary | ICD-10-CM | POA: Diagnosis not present

## 2018-03-22 DIAGNOSIS — I129 Hypertensive chronic kidney disease with stage 1 through stage 4 chronic kidney disease, or unspecified chronic kidney disease: Secondary | ICD-10-CM | POA: Diagnosis not present

## 2018-03-22 DIAGNOSIS — R2681 Unsteadiness on feet: Secondary | ICD-10-CM | POA: Diagnosis not present

## 2018-03-22 DIAGNOSIS — I350 Nonrheumatic aortic (valve) stenosis: Secondary | ICD-10-CM | POA: Diagnosis not present

## 2018-03-22 DIAGNOSIS — M150 Primary generalized (osteo)arthritis: Secondary | ICD-10-CM | POA: Diagnosis not present

## 2018-03-23 DIAGNOSIS — N183 Chronic kidney disease, stage 3 (moderate): Secondary | ICD-10-CM | POA: Diagnosis not present

## 2018-03-23 DIAGNOSIS — I350 Nonrheumatic aortic (valve) stenosis: Secondary | ICD-10-CM | POA: Diagnosis not present

## 2018-03-23 DIAGNOSIS — M5126 Other intervertebral disc displacement, lumbar region: Secondary | ICD-10-CM | POA: Diagnosis not present

## 2018-03-23 DIAGNOSIS — I129 Hypertensive chronic kidney disease with stage 1 through stage 4 chronic kidney disease, or unspecified chronic kidney disease: Secondary | ICD-10-CM | POA: Diagnosis not present

## 2018-03-23 DIAGNOSIS — R2681 Unsteadiness on feet: Secondary | ICD-10-CM | POA: Diagnosis not present

## 2018-03-23 DIAGNOSIS — M150 Primary generalized (osteo)arthritis: Secondary | ICD-10-CM | POA: Diagnosis not present

## 2018-03-27 DIAGNOSIS — R2681 Unsteadiness on feet: Secondary | ICD-10-CM | POA: Diagnosis not present

## 2018-03-27 DIAGNOSIS — M150 Primary generalized (osteo)arthritis: Secondary | ICD-10-CM | POA: Diagnosis not present

## 2018-03-27 DIAGNOSIS — N183 Chronic kidney disease, stage 3 (moderate): Secondary | ICD-10-CM | POA: Diagnosis not present

## 2018-03-27 DIAGNOSIS — I129 Hypertensive chronic kidney disease with stage 1 through stage 4 chronic kidney disease, or unspecified chronic kidney disease: Secondary | ICD-10-CM | POA: Diagnosis not present

## 2018-03-27 DIAGNOSIS — M5126 Other intervertebral disc displacement, lumbar region: Secondary | ICD-10-CM | POA: Diagnosis not present

## 2018-03-27 DIAGNOSIS — I350 Nonrheumatic aortic (valve) stenosis: Secondary | ICD-10-CM | POA: Diagnosis not present

## 2018-03-28 DIAGNOSIS — R2681 Unsteadiness on feet: Secondary | ICD-10-CM | POA: Diagnosis not present

## 2018-03-28 DIAGNOSIS — N183 Chronic kidney disease, stage 3 (moderate): Secondary | ICD-10-CM | POA: Diagnosis not present

## 2018-03-28 DIAGNOSIS — M150 Primary generalized (osteo)arthritis: Secondary | ICD-10-CM | POA: Diagnosis not present

## 2018-03-28 DIAGNOSIS — M5126 Other intervertebral disc displacement, lumbar region: Secondary | ICD-10-CM | POA: Diagnosis not present

## 2018-03-28 DIAGNOSIS — I129 Hypertensive chronic kidney disease with stage 1 through stage 4 chronic kidney disease, or unspecified chronic kidney disease: Secondary | ICD-10-CM | POA: Diagnosis not present

## 2018-03-28 DIAGNOSIS — I350 Nonrheumatic aortic (valve) stenosis: Secondary | ICD-10-CM | POA: Diagnosis not present

## 2018-03-29 DIAGNOSIS — R2681 Unsteadiness on feet: Secondary | ICD-10-CM | POA: Diagnosis not present

## 2018-03-29 DIAGNOSIS — M5126 Other intervertebral disc displacement, lumbar region: Secondary | ICD-10-CM | POA: Diagnosis not present

## 2018-03-29 DIAGNOSIS — I350 Nonrheumatic aortic (valve) stenosis: Secondary | ICD-10-CM | POA: Diagnosis not present

## 2018-03-29 DIAGNOSIS — M150 Primary generalized (osteo)arthritis: Secondary | ICD-10-CM | POA: Diagnosis not present

## 2018-03-29 DIAGNOSIS — I129 Hypertensive chronic kidney disease with stage 1 through stage 4 chronic kidney disease, or unspecified chronic kidney disease: Secondary | ICD-10-CM | POA: Diagnosis not present

## 2018-03-29 DIAGNOSIS — N183 Chronic kidney disease, stage 3 (moderate): Secondary | ICD-10-CM | POA: Diagnosis not present

## 2018-04-03 DIAGNOSIS — M150 Primary generalized (osteo)arthritis: Secondary | ICD-10-CM | POA: Diagnosis not present

## 2018-04-03 DIAGNOSIS — N183 Chronic kidney disease, stage 3 (moderate): Secondary | ICD-10-CM | POA: Diagnosis not present

## 2018-04-03 DIAGNOSIS — R2681 Unsteadiness on feet: Secondary | ICD-10-CM | POA: Diagnosis not present

## 2018-04-03 DIAGNOSIS — M5126 Other intervertebral disc displacement, lumbar region: Secondary | ICD-10-CM | POA: Diagnosis not present

## 2018-04-03 DIAGNOSIS — I350 Nonrheumatic aortic (valve) stenosis: Secondary | ICD-10-CM | POA: Diagnosis not present

## 2018-04-03 DIAGNOSIS — I129 Hypertensive chronic kidney disease with stage 1 through stage 4 chronic kidney disease, or unspecified chronic kidney disease: Secondary | ICD-10-CM | POA: Diagnosis not present

## 2018-04-05 DIAGNOSIS — I129 Hypertensive chronic kidney disease with stage 1 through stage 4 chronic kidney disease, or unspecified chronic kidney disease: Secondary | ICD-10-CM | POA: Diagnosis not present

## 2018-04-05 DIAGNOSIS — M5126 Other intervertebral disc displacement, lumbar region: Secondary | ICD-10-CM | POA: Diagnosis not present

## 2018-04-05 DIAGNOSIS — N183 Chronic kidney disease, stage 3 (moderate): Secondary | ICD-10-CM | POA: Diagnosis not present

## 2018-04-05 DIAGNOSIS — I350 Nonrheumatic aortic (valve) stenosis: Secondary | ICD-10-CM | POA: Diagnosis not present

## 2018-04-05 DIAGNOSIS — R2681 Unsteadiness on feet: Secondary | ICD-10-CM | POA: Diagnosis not present

## 2018-04-05 DIAGNOSIS — M150 Primary generalized (osteo)arthritis: Secondary | ICD-10-CM | POA: Diagnosis not present

## 2018-04-06 DIAGNOSIS — I129 Hypertensive chronic kidney disease with stage 1 through stage 4 chronic kidney disease, or unspecified chronic kidney disease: Secondary | ICD-10-CM | POA: Diagnosis not present

## 2018-04-06 DIAGNOSIS — R2681 Unsteadiness on feet: Secondary | ICD-10-CM | POA: Diagnosis not present

## 2018-04-06 DIAGNOSIS — M150 Primary generalized (osteo)arthritis: Secondary | ICD-10-CM | POA: Diagnosis not present

## 2018-04-06 DIAGNOSIS — N183 Chronic kidney disease, stage 3 (moderate): Secondary | ICD-10-CM | POA: Diagnosis not present

## 2018-04-06 DIAGNOSIS — I350 Nonrheumatic aortic (valve) stenosis: Secondary | ICD-10-CM | POA: Diagnosis not present

## 2018-04-06 DIAGNOSIS — M5126 Other intervertebral disc displacement, lumbar region: Secondary | ICD-10-CM | POA: Diagnosis not present

## 2018-04-10 DIAGNOSIS — M5126 Other intervertebral disc displacement, lumbar region: Secondary | ICD-10-CM | POA: Diagnosis not present

## 2018-04-10 DIAGNOSIS — I350 Nonrheumatic aortic (valve) stenosis: Secondary | ICD-10-CM | POA: Diagnosis not present

## 2018-04-10 DIAGNOSIS — I129 Hypertensive chronic kidney disease with stage 1 through stage 4 chronic kidney disease, or unspecified chronic kidney disease: Secondary | ICD-10-CM | POA: Diagnosis not present

## 2018-04-10 DIAGNOSIS — N183 Chronic kidney disease, stage 3 (moderate): Secondary | ICD-10-CM | POA: Diagnosis not present

## 2018-04-10 DIAGNOSIS — R2681 Unsteadiness on feet: Secondary | ICD-10-CM | POA: Diagnosis not present

## 2018-04-10 DIAGNOSIS — M150 Primary generalized (osteo)arthritis: Secondary | ICD-10-CM | POA: Diagnosis not present

## 2018-04-11 DIAGNOSIS — N183 Chronic kidney disease, stage 3 (moderate): Secondary | ICD-10-CM | POA: Diagnosis not present

## 2018-04-11 DIAGNOSIS — M150 Primary generalized (osteo)arthritis: Secondary | ICD-10-CM | POA: Diagnosis not present

## 2018-04-11 DIAGNOSIS — R2681 Unsteadiness on feet: Secondary | ICD-10-CM | POA: Diagnosis not present

## 2018-04-11 DIAGNOSIS — M5126 Other intervertebral disc displacement, lumbar region: Secondary | ICD-10-CM | POA: Diagnosis not present

## 2018-04-11 DIAGNOSIS — I350 Nonrheumatic aortic (valve) stenosis: Secondary | ICD-10-CM | POA: Diagnosis not present

## 2018-04-11 DIAGNOSIS — I129 Hypertensive chronic kidney disease with stage 1 through stage 4 chronic kidney disease, or unspecified chronic kidney disease: Secondary | ICD-10-CM | POA: Diagnosis not present

## 2018-04-13 DIAGNOSIS — I350 Nonrheumatic aortic (valve) stenosis: Secondary | ICD-10-CM | POA: Diagnosis not present

## 2018-04-13 DIAGNOSIS — I129 Hypertensive chronic kidney disease with stage 1 through stage 4 chronic kidney disease, or unspecified chronic kidney disease: Secondary | ICD-10-CM | POA: Diagnosis not present

## 2018-04-13 DIAGNOSIS — R2681 Unsteadiness on feet: Secondary | ICD-10-CM | POA: Diagnosis not present

## 2018-04-13 DIAGNOSIS — M150 Primary generalized (osteo)arthritis: Secondary | ICD-10-CM | POA: Diagnosis not present

## 2018-04-13 DIAGNOSIS — M5126 Other intervertebral disc displacement, lumbar region: Secondary | ICD-10-CM | POA: Diagnosis not present

## 2018-04-13 DIAGNOSIS — N183 Chronic kidney disease, stage 3 (moderate): Secondary | ICD-10-CM | POA: Diagnosis not present

## 2018-04-14 DIAGNOSIS — R2681 Unsteadiness on feet: Secondary | ICD-10-CM | POA: Diagnosis not present

## 2018-04-14 DIAGNOSIS — M150 Primary generalized (osteo)arthritis: Secondary | ICD-10-CM | POA: Diagnosis not present

## 2018-04-14 DIAGNOSIS — M5126 Other intervertebral disc displacement, lumbar region: Secondary | ICD-10-CM | POA: Diagnosis not present

## 2018-04-14 DIAGNOSIS — I129 Hypertensive chronic kidney disease with stage 1 through stage 4 chronic kidney disease, or unspecified chronic kidney disease: Secondary | ICD-10-CM | POA: Diagnosis not present

## 2018-04-14 DIAGNOSIS — N183 Chronic kidney disease, stage 3 (moderate): Secondary | ICD-10-CM | POA: Diagnosis not present

## 2018-04-14 DIAGNOSIS — I350 Nonrheumatic aortic (valve) stenosis: Secondary | ICD-10-CM | POA: Diagnosis not present

## 2018-04-16 DIAGNOSIS — I129 Hypertensive chronic kidney disease with stage 1 through stage 4 chronic kidney disease, or unspecified chronic kidney disease: Secondary | ICD-10-CM | POA: Diagnosis not present

## 2018-04-16 DIAGNOSIS — N183 Chronic kidney disease, stage 3 (moderate): Secondary | ICD-10-CM | POA: Diagnosis not present

## 2018-04-16 DIAGNOSIS — I350 Nonrheumatic aortic (valve) stenosis: Secondary | ICD-10-CM | POA: Diagnosis not present

## 2018-04-16 DIAGNOSIS — M5126 Other intervertebral disc displacement, lumbar region: Secondary | ICD-10-CM | POA: Diagnosis not present

## 2018-04-16 DIAGNOSIS — M150 Primary generalized (osteo)arthritis: Secondary | ICD-10-CM | POA: Diagnosis not present

## 2018-04-16 DIAGNOSIS — R2681 Unsteadiness on feet: Secondary | ICD-10-CM | POA: Diagnosis not present

## 2018-04-17 DIAGNOSIS — I129 Hypertensive chronic kidney disease with stage 1 through stage 4 chronic kidney disease, or unspecified chronic kidney disease: Secondary | ICD-10-CM | POA: Diagnosis not present

## 2018-04-17 DIAGNOSIS — N183 Chronic kidney disease, stage 3 (moderate): Secondary | ICD-10-CM | POA: Diagnosis not present

## 2018-04-17 DIAGNOSIS — M150 Primary generalized (osteo)arthritis: Secondary | ICD-10-CM | POA: Diagnosis not present

## 2018-04-17 DIAGNOSIS — R2681 Unsteadiness on feet: Secondary | ICD-10-CM | POA: Diagnosis not present

## 2018-04-17 DIAGNOSIS — M5126 Other intervertebral disc displacement, lumbar region: Secondary | ICD-10-CM | POA: Diagnosis not present

## 2018-04-17 DIAGNOSIS — I350 Nonrheumatic aortic (valve) stenosis: Secondary | ICD-10-CM | POA: Diagnosis not present

## 2018-04-19 DIAGNOSIS — R2681 Unsteadiness on feet: Secondary | ICD-10-CM | POA: Diagnosis not present

## 2018-04-19 DIAGNOSIS — N183 Chronic kidney disease, stage 3 (moderate): Secondary | ICD-10-CM | POA: Diagnosis not present

## 2018-04-19 DIAGNOSIS — I350 Nonrheumatic aortic (valve) stenosis: Secondary | ICD-10-CM | POA: Diagnosis not present

## 2018-04-19 DIAGNOSIS — M150 Primary generalized (osteo)arthritis: Secondary | ICD-10-CM | POA: Diagnosis not present

## 2018-04-19 DIAGNOSIS — M5126 Other intervertebral disc displacement, lumbar region: Secondary | ICD-10-CM | POA: Diagnosis not present

## 2018-04-19 DIAGNOSIS — I129 Hypertensive chronic kidney disease with stage 1 through stage 4 chronic kidney disease, or unspecified chronic kidney disease: Secondary | ICD-10-CM | POA: Diagnosis not present

## 2018-04-20 DIAGNOSIS — I350 Nonrheumatic aortic (valve) stenosis: Secondary | ICD-10-CM | POA: Diagnosis not present

## 2018-04-20 DIAGNOSIS — M150 Primary generalized (osteo)arthritis: Secondary | ICD-10-CM | POA: Diagnosis not present

## 2018-04-20 DIAGNOSIS — N183 Chronic kidney disease, stage 3 (moderate): Secondary | ICD-10-CM | POA: Diagnosis not present

## 2018-04-20 DIAGNOSIS — M5126 Other intervertebral disc displacement, lumbar region: Secondary | ICD-10-CM | POA: Diagnosis not present

## 2018-04-20 DIAGNOSIS — I129 Hypertensive chronic kidney disease with stage 1 through stage 4 chronic kidney disease, or unspecified chronic kidney disease: Secondary | ICD-10-CM | POA: Diagnosis not present

## 2018-04-20 DIAGNOSIS — R2681 Unsteadiness on feet: Secondary | ICD-10-CM | POA: Diagnosis not present

## 2018-04-24 DIAGNOSIS — I129 Hypertensive chronic kidney disease with stage 1 through stage 4 chronic kidney disease, or unspecified chronic kidney disease: Secondary | ICD-10-CM | POA: Diagnosis not present

## 2018-04-24 DIAGNOSIS — M150 Primary generalized (osteo)arthritis: Secondary | ICD-10-CM | POA: Diagnosis not present

## 2018-04-24 DIAGNOSIS — N183 Chronic kidney disease, stage 3 (moderate): Secondary | ICD-10-CM | POA: Diagnosis not present

## 2018-04-24 DIAGNOSIS — R2681 Unsteadiness on feet: Secondary | ICD-10-CM | POA: Diagnosis not present

## 2018-04-24 DIAGNOSIS — I350 Nonrheumatic aortic (valve) stenosis: Secondary | ICD-10-CM | POA: Diagnosis not present

## 2018-04-24 DIAGNOSIS — M5126 Other intervertebral disc displacement, lumbar region: Secondary | ICD-10-CM | POA: Diagnosis not present

## 2018-09-17 DIAGNOSIS — Z0000 Encounter for general adult medical examination without abnormal findings: Secondary | ICD-10-CM | POA: Diagnosis not present

## 2018-09-17 DIAGNOSIS — Z23 Encounter for immunization: Secondary | ICD-10-CM | POA: Diagnosis not present

## 2018-09-17 DIAGNOSIS — I129 Hypertensive chronic kidney disease with stage 1 through stage 4 chronic kidney disease, or unspecified chronic kidney disease: Secondary | ICD-10-CM | POA: Diagnosis not present

## 2018-09-17 DIAGNOSIS — D649 Anemia, unspecified: Secondary | ICD-10-CM | POA: Diagnosis not present

## 2018-09-17 DIAGNOSIS — M150 Primary generalized (osteo)arthritis: Secondary | ICD-10-CM | POA: Diagnosis not present

## 2018-09-17 DIAGNOSIS — I10 Essential (primary) hypertension: Secondary | ICD-10-CM | POA: Diagnosis not present

## 2018-09-17 DIAGNOSIS — N183 Chronic kidney disease, stage 3 (moderate): Secondary | ICD-10-CM | POA: Diagnosis not present

## 2018-09-17 DIAGNOSIS — E782 Mixed hyperlipidemia: Secondary | ICD-10-CM | POA: Diagnosis not present

## 2018-09-24 DIAGNOSIS — Z0000 Encounter for general adult medical examination without abnormal findings: Secondary | ICD-10-CM | POA: Diagnosis not present

## 2018-09-24 DIAGNOSIS — I129 Hypertensive chronic kidney disease with stage 1 through stage 4 chronic kidney disease, or unspecified chronic kidney disease: Secondary | ICD-10-CM | POA: Diagnosis not present

## 2018-09-24 DIAGNOSIS — D696 Thrombocytopenia, unspecified: Secondary | ICD-10-CM | POA: Diagnosis not present

## 2018-09-24 DIAGNOSIS — I10 Essential (primary) hypertension: Secondary | ICD-10-CM | POA: Diagnosis not present

## 2018-09-24 DIAGNOSIS — I480 Paroxysmal atrial fibrillation: Secondary | ICD-10-CM | POA: Diagnosis not present

## 2018-10-15 DIAGNOSIS — I480 Paroxysmal atrial fibrillation: Secondary | ICD-10-CM | POA: Diagnosis not present

## 2018-10-15 DIAGNOSIS — I129 Hypertensive chronic kidney disease with stage 1 through stage 4 chronic kidney disease, or unspecified chronic kidney disease: Secondary | ICD-10-CM | POA: Diagnosis not present

## 2018-10-15 DIAGNOSIS — I10 Essential (primary) hypertension: Secondary | ICD-10-CM | POA: Diagnosis not present

## 2018-10-15 DIAGNOSIS — I359 Nonrheumatic aortic valve disorder, unspecified: Secondary | ICD-10-CM | POA: Diagnosis not present

## 2019-02-11 DIAGNOSIS — E782 Mixed hyperlipidemia: Secondary | ICD-10-CM | POA: Diagnosis not present

## 2019-02-11 DIAGNOSIS — I129 Hypertensive chronic kidney disease with stage 1 through stage 4 chronic kidney disease, or unspecified chronic kidney disease: Secondary | ICD-10-CM | POA: Diagnosis not present

## 2019-02-22 ENCOUNTER — Other Ambulatory Visit: Payer: Self-pay

## 2019-02-22 NOTE — Patient Outreach (Signed)
[facility]) Care Management  02/22/2019  SHALESE GEDNEY 1924-05-31 [id number]    Medication Adherence call to Mrs. Iyana Turi spoke with patient she is past due on Lovastatin 40 mg patient said she pick up this medication yesterday and did not want to continue going over her medication because she was celebrating her birthday. Mrs. Murchison is showing past due under [facility].   Lillia Abed CPhT Pharmacy Technician [facility] Care Management Direct Dial [phone]  Fax [phone] [email or web address]

## 2019-06-13 DIAGNOSIS — I129 Hypertensive chronic kidney disease with stage 1 through stage 4 chronic kidney disease, or unspecified chronic kidney disease: Secondary | ICD-10-CM | POA: Diagnosis not present

## 2019-06-13 DIAGNOSIS — Z7189 Other specified counseling: Secondary | ICD-10-CM | POA: Diagnosis not present

## 2019-06-13 DIAGNOSIS — E782 Mixed hyperlipidemia: Secondary | ICD-10-CM | POA: Diagnosis not present

## 2019-08-04 ENCOUNTER — Other Ambulatory Visit: Payer: Self-pay

## 2019-08-04 ENCOUNTER — Emergency Department (HOSPITAL_COMMUNITY): Payer: Medicare Other

## 2019-08-04 ENCOUNTER — Emergency Department (HOSPITAL_COMMUNITY)
Admission: EM | Admit: 2019-08-04 | Discharge: 2019-08-04 | Disposition: A | Discharge status: Home / Self Care | Payer: Medicare Other | Attending: Emergency Medicine | Admitting: Emergency Medicine

## 2019-08-04 DIAGNOSIS — I350 Nonrheumatic aortic (valve) stenosis: Secondary | ICD-10-CM | POA: Insufficient documentation

## 2019-08-04 DIAGNOSIS — S0993XA Unspecified injury of face, initial encounter: Secondary | ICD-10-CM | POA: Diagnosis not present

## 2019-08-04 DIAGNOSIS — S72141A Displaced intertrochanteric fracture of right femur, initial encounter for closed fracture: Secondary | ICD-10-CM | POA: Diagnosis not present

## 2019-08-04 DIAGNOSIS — Y93E3 Activity, vacuuming: Secondary | ICD-10-CM | POA: Diagnosis not present

## 2019-08-04 DIAGNOSIS — W19XXXA Unspecified fall, initial encounter: Secondary | ICD-10-CM

## 2019-08-04 DIAGNOSIS — Z96643 Presence of artificial hip joint, bilateral: Secondary | ICD-10-CM | POA: Insufficient documentation

## 2019-08-04 DIAGNOSIS — W0110XA Fall on same level from slipping, tripping and stumbling with subsequent striking against unspecified object, initial encounter: Secondary | ICD-10-CM | POA: Insufficient documentation

## 2019-08-04 DIAGNOSIS — S199XXA Unspecified injury of neck, initial encounter: Secondary | ICD-10-CM | POA: Diagnosis not present

## 2019-08-04 DIAGNOSIS — M25551 Pain in right hip: Secondary | ICD-10-CM

## 2019-08-04 DIAGNOSIS — Z79899 Other long term (current) drug therapy: Secondary | ICD-10-CM | POA: Insufficient documentation

## 2019-08-04 DIAGNOSIS — Y92099 Unspecified place in other non-institutional residence as the place of occurrence of the external cause: Secondary | ICD-10-CM | POA: Insufficient documentation

## 2019-08-04 DIAGNOSIS — M79604 Pain in right leg: Secondary | ICD-10-CM | POA: Diagnosis not present

## 2019-08-04 DIAGNOSIS — R609 Edema, unspecified: Secondary | ICD-10-CM | POA: Diagnosis not present

## 2019-08-04 DIAGNOSIS — Z7982 Long term (current) use of aspirin: Secondary | ICD-10-CM | POA: Diagnosis not present

## 2019-08-04 DIAGNOSIS — Z01818 Encounter for other preprocedural examination: Secondary | ICD-10-CM

## 2019-08-04 DIAGNOSIS — Y998 Other external cause status: Secondary | ICD-10-CM | POA: Insufficient documentation

## 2019-08-04 DIAGNOSIS — Z96641 Presence of right artificial hip joint: Secondary | ICD-10-CM | POA: Diagnosis not present

## 2019-08-04 DIAGNOSIS — S0990XA Unspecified injury of head, initial encounter: Secondary | ICD-10-CM | POA: Diagnosis not present

## 2019-08-04 DIAGNOSIS — S098XXA Other specified injuries of head, initial encounter: Secondary | ICD-10-CM | POA: Diagnosis not present

## 2019-08-04 DIAGNOSIS — I10 Essential (primary) hypertension: Secondary | ICD-10-CM | POA: Insufficient documentation

## 2019-08-04 DIAGNOSIS — I517 Cardiomegaly: Secondary | ICD-10-CM | POA: Diagnosis not present

## 2019-08-04 LAB — URINALYSIS, COMPLETE (UACMP) WITH MICROSCOPIC
Bacteria, UA: NONE SEEN
Bilirubin Urine: NEGATIVE
Glucose, UA: NEGATIVE mg/dL
Ketones, ur: NEGATIVE mg/dL
Leukocytes,Ua: NEGATIVE
Nitrite: NEGATIVE
Protein, ur: 100 mg/dL — AB
Specific Gravity, Urine: 1.01 (ref 1.005–1.030)
pH: 6 (ref 5.0–8.0)

## 2019-08-04 LAB — CBC WITH DIFFERENTIAL/PLATELET
Abs Immature Granulocytes: 0.02 10*3/uL (ref 0.00–0.07)
Basophils Absolute: 0 10*3/uL (ref 0.0–0.1)
Basophils Relative: 0 %
Eosinophils Absolute: 0 10*3/uL (ref 0.0–0.5)
Eosinophils Relative: 0 %
HCT: 34.3 % — ABNORMAL LOW (ref 36.0–46.0)
Hemoglobin: 10.8 g/dL — ABNORMAL LOW (ref 12.0–15.0)
Immature Granulocytes: 0 %
Lymphocytes Relative: 7 %
Lymphs Abs: 0.6 10*3/uL — ABNORMAL LOW (ref 0.7–4.0)
MCH: 30.7 pg (ref 26.0–34.0)
MCHC: 31.5 g/dL (ref 30.0–36.0)
MCV: 97.4 fL (ref 80.0–100.0)
Monocytes Absolute: 0.4 10*3/uL (ref 0.1–1.0)
Monocytes Relative: 5 %
Neutro Abs: 7.6 10*3/uL (ref 1.7–7.7)
Neutrophils Relative %: 88 %
Platelets: 94 10*3/uL — ABNORMAL LOW (ref 150–400)
RBC: 3.52 MIL/uL — ABNORMAL LOW (ref 3.87–5.11)
RDW: 14.9 % (ref 11.5–15.5)
WBC: 8.7 10*3/uL (ref 4.0–10.5)
nRBC: 0 % (ref 0.0–0.2)

## 2019-08-04 LAB — COMPREHENSIVE METABOLIC PANEL
ALT: 13 U/L (ref 0–44)
AST: 28 U/L (ref 15–41)
Albumin: 3.8 g/dL (ref 3.5–5.0)
Alkaline Phosphatase: 63 U/L (ref 38–126)
Anion gap: 10 (ref 5–15)
BUN: 19 mg/dL (ref 8–23)
CO2: 24 mmol/L (ref 22–32)
Calcium: 9.4 mg/dL (ref 8.9–10.3)
Chloride: 106 mmol/L (ref 98–111)
Creatinine, Ser: 0.96 mg/dL (ref 0.44–1.00)
GFR calc Af Amer: 59 mL/min — ABNORMAL LOW (ref >60)
GFR calc non Af Amer: 51 mL/min — ABNORMAL LOW (ref >60)
Glucose, Bld: 116 mg/dL — ABNORMAL HIGH (ref 70–99)
Potassium: 4.9 mmol/L (ref 3.5–5.1)
Sodium: 140 mmol/L (ref 135–145)
Total Bilirubin: 1.1 mg/dL (ref 0.3–1.2)
Total Protein: 7 g/dL (ref 6.5–8.1)

## 2019-08-04 LAB — TYPE AND SCREEN
ABO/RH(D): O POS
Antibody Screen: NEGATIVE

## 2019-08-04 LAB — PROTIME-INR
INR: 1.2 (ref 0.8–1.2)
Prothrombin Time: 15.1 seconds (ref 11.4–15.2)

## 2019-08-04 MED ORDER — FENTANYL CITRATE (PF) 100 MCG/2ML IJ SOLN: 25.0000 ug | INTRAMUSCULAR | Status: DC | PRN

## 2019-08-04 MED ORDER — ACETAMINOPHEN 500 MG PO TABS
1000.0000 mg | ORAL_TABLET | Freq: Once | ORAL | Status: AC
  Administered 2019-08-04: 19:00:00 1000 mg via ORAL
  Filled 2019-08-04: qty 2

## 2019-08-04 NOTE — ED Triage Notes (Signed)
Pt brought in by [facility] from home following a mechanical fall while vacuuming. Per EMS pt got caught up in the cord and fell on her right side. Pt c/o right thigh pain. Pt has swelling noted to right upper lip, pt denies pain to the area. Per EMS pt has hx of dementia, but at this time is able to answer all questions appropriately.

## 2019-08-04 NOTE — ED Notes (Signed)
Patient transported to X-ray 

## 2019-08-04 NOTE — ED Provider Notes (Signed)
CT hip pending, if negative attempt ambulation/weight bearing with walker. Family wishes to take home. Physical Exam  BP (!) 189/57 (BP Location: Right Arm)   Pulse 73   Temp (!) 96.8 F (36 C) (Rectal)   Resp 19   SpO2 97%   Physical Exam  ED Course/Procedures     Procedures  MDM  Nondisplaced fractures identified.  Orthopedics consulted.  Patient is stable for discharge with home care.  I have reviewed this with family members who are prepared to assist the patient fully at home and proceed with outpatient management.       Charlesetta Shanks, MD 08/08/19 [phone]

## 2019-08-04 NOTE — ED Notes (Signed)
Pt transported from xray directly to CT

## 2019-08-04 NOTE — ED Provider Notes (Signed)
[facility] EMERGENCY DEPARTMENT Provider Note   CSN: [id number] Arrival date & time: 08/04/19  1120     History   Chief Complaint Chief Complaint  Patient presents with  . Fall  . Leg Pain    HPI [name] is a 85 y.o. female.     HPI  Level 5 [place] reported that patient has dementia 85 year old female presents today via EMS with reports that she fell at home while vacuuming.  She reports she got tangled up in the cord and had a mechanical fall.  She is complaining of pain to the right hip and mid thigh.  She states that she was able to get up with assistance.  She denies any headache or head injury but has obvious swelling to her lip right lower jaw.   Past Medical History:  Diagnosis Date  . Femur fracture, left ([place])    a. 10/2013 Acute oblique Fx through te midshaft of the left femur w/ anteromedial displacement, medial angulation, and ~ 10 cm foreshortening/overlap.  Marland Kitchen Hyperlipidemia   . Hypertension   . Osteoarthritis    a. 04/2005 s/p L total hip arthroplasty  . Osteopenia   . Severe aortic stenosis    a. 10/2013 Echo: EF 65-70%, mild conc LVH, Gr 1 DD, sev AS, mild to mod AI, Valve area: 0.89cm^2 (VTI), 0.85cm^2 (Vmax), mildly dil LA, mildly dil RA, PASP 12mHg.    Patient Active Problem List   Diagnosis Date Noted  . Acute blood loss anemia 11/22/2013  . Aortic stenosis, severe: PER 2 D ECHO 11/21/13 11/21/2013  . Closed displaced oblique fracture of shaft of left femur ([place]) 11/20/2013  . HTN (hypertension) 11/20/2013  . Systolic murmur 27/25/3664  . Hypercholesterolemia 11/20/2013  . Fracture, femur, shaft ([place]) 11/20/2013    Past Surgical History:  Procedure Laterality Date  . ABDOMINAL HYSTERECTOMY    . ORIF FEMUR FRACTURE Left 11/22/2013  . ORIF FEMUR FRACTURE Left 11/22/2013   Procedure: OPEN REDUCTION INTERNAL FIXATION (ORIF) DISTAL FEMUR FRACTURE;  Surgeon: Marybelle Killings, MD;  Location: [facility];  Service: Orthopedics;   Laterality: Left;  . TOTAL HIP ARTHROPLASTY Bilateral      OB History   No obstetric history on file.      Home Medications    Prior to Admission medications   Medication Sig Start Date End Date Taking? Authorizing Provider  aspirin EC 325 MG EC tablet Take 1 tablet (325 mg total) by mouth daily with breakfast. Patient not taking: Reported on 02/02/2018 11/25/13   Phillips Hay, PA-C  aspirin EC 81 MG tablet Take 81 mg by mouth daily.    [provider]  docusate sodium (COLACE) 100 MG capsule Take 1 capsule (100 mg total) by mouth every 12 (twelve) hours. 02/02/18   Margarita Mail, PA-C  hydrochlorothiazide (HYDRODIURIL) 25 MG tablet Take 25 mg by mouth every evening.  12/25/17   [provider]  HYDROcodone-acetaminophen (NORCO) 5-325 MG tablet Take 1 tablet by mouth every 6 (six) hours as needed for severe pain. 02/02/18   Harris, Vernie Shanks, PA-C  lisinopril (PRINIVIL,ZESTRIL) 20 MG tablet Take 1 tablet (20 mg total) by mouth daily. Patient not taking: Reported on 02/02/2018 11/28/13   Sheila Oats, MD  lovastatin (MEVACOR) 40 MG tablet Take 40 mg by mouth daily.  10/28/13   [provider]  metoprolol (LOPRESSOR) 50 MG tablet Take 1 tablet (50 mg total) by mouth 2 (two) times daily. Patient not taking: Reported on 02/02/2018  11/28/13   Viyuoh, Rolland Bimler, MD  metoprolol tartrate (LOPRESSOR) 25 MG tablet Take 25 mg by mouth daily. 12/25/17   [provider]  ondansetron (ZOFRAN-ODT) 8 MG disintegrating tablet Take 1 tablet (8 mg total) by mouth every 8 (eight) hours as needed for nausea or vomiting. Patient not taking: Reported on 02/02/2018 12/12/13   Benjiman Core, MD  polyethylene glycol [facility] Ethelene Hal) packet Take 17 g by mouth daily as needed for mild constipation. Patient not taking: Reported on 02/02/2018 11/27/13   Kela Millin, MD    Family History Family History  Problem Relation Age of Onset  . Heart attack Mother        MI in her 17's   . Stroke Father        died in his 15's.    Social History Social History   Tobacco Use  . Smoking status: Never Smoker  . Smokeless tobacco: Never Used  Substance Use Topics  . Alcohol use: No  . Drug use: No     Allergies   Patient has no known allergies.   Review of Systems Review of Systems  All other systems reviewed and are negative.    Physical Exam Updated Vital Signs BP (!) 191/75 (BP Location: Left Arm)   Pulse 71   Temp (!) 96.8 F (36 C) (Rectal)   Resp (!) 21   SpO2 97%   Physical Exam Vitals signs and nursing note reviewed.  Constitutional:      Appearance: Normal appearance. She is normal weight.  HENT:     Head: Normocephalic.      Right Ear: External ear normal.     Left Ear: External ear normal.     Nose: Nose normal.     Mouth/Throat:     Mouth: Mucous membranes are moist.     Comments: No malocclusion or dental injury or intraoral laceration noted Contusion swelling of right upper lip Eyes:     Extraocular Movements: Extraocular movements intact.     Pupils: Pupils are equal, round, and reactive to light.  Neck:     Musculoskeletal: Normal range of motion.  Cardiovascular:     Rate and Rhythm: Normal rate and regular rhythm.     Comments: Femoral pulses are 2+ bilaterally unable to palpate dorsal totalis pulses will obtain Doppler Pulmonary:     Effort: Pulmonary effort is normal.     Breath sounds: Normal breath sounds.  Abdominal:     General: Abdomen is flat. Bowel sounds are normal. There is no distension.     Palpations: Abdomen is soft.     Tenderness: There is no abdominal tenderness.  Musculoskeletal:     Comments: Right hip with tenderness palpation on the hip and to the upper thigh No obvious deformity No discoloration patient does not want to move the right hip No tenderness on the right knee, lower leg, ankle, or foot Left leg appears normal with no tenderness palpation and full active range of motion Entire spine  palpated with no tenderness No obvious signs of trauma on back or chest wall  Skin:    General: Skin is warm.     Capillary Refill: Capillary refill takes less than 2 seconds.  Neurological:     General: No focal deficit present.     Mental Status: She is alert.     Cranial Nerves: No cranial nerve deficit.     Motor: No weakness.  Psychiatric:        Mood  and Affect: Mood normal.        Behavior: Behavior normal.      ED Treatments / Results  Labs (all labs ordered are listed, but only abnormal results are displayed) Labs Reviewed  CBC WITH DIFFERENTIAL/PLATELET  PROTIME-INR  COMPREHENSIVE METABOLIC PANEL  URINALYSIS, ROUTINE W REFLEX MICROSCOPIC  URINALYSIS, COMPLETE (UACMP) WITH MICROSCOPIC  TYPE AND SCREEN    EKG None  Radiology No results found.  Procedures Procedures (including critical care time)  Medications Ordered in ED Medications  fentaNYL (SUBLIMAZE) injection 25 mcg (has no administration in time range)     Initial Impression / Assessment and Plan / ED Course  I have reviewed the triage vital signs and the nursing notes.  Pertinent labs & imaging results that were available during my care of the patient were reviewed by me and considered in my medical decision making (see chart for details).     Discussed with Dr. August Saucerean.  He has reviewed images.  Advises CT.  Patient will have fracture on CT can attempt to bear weight Asked with Dr. Clarice PolePfeifer and she will call CT and disposition patient  Final Clinical Impressions(s) / ED Diagnoses   Final diagnoses:  Fall, initial encounter  Right hip pain    ED Discharge Orders    None       Margarita Grizzleay, Norell Brisbin, MD 08/04/19 1700

## 2019-08-04 NOTE — ED Notes (Signed)
Pt transported to CT ?

## 2019-08-04 NOTE — Discharge Instructions (Addendum)
1.  There is a nondisplaced fracture in the right hip around the prosthesis.  Do not put any weight on the right leg.  With assistance, you may use the left leg and pivot or help with a walker. 2.  Take extra strength Tylenol every 6 hours as needed for pain. 3.  Call [facility] first thing Tuesday morning to schedule your recheck that day.

## 2019-08-04 NOTE — ED Notes (Signed)
Patient and grandson Clare Gandy verbalize understanding of discharge instructions. Opportunity for questioning and answers were provided. Armband removed by staff, pt discharged from ED.

## 2019-08-05 LAB — URINE CULTURE: Culture: NO GROWTH

## 2019-08-13 ENCOUNTER — Ambulatory Visit: Payer: Medicare Other | Admitting: Orthopaedic Surgery

## 2019-10-04 DIAGNOSIS — D696 Thrombocytopenia, unspecified: Secondary | ICD-10-CM | POA: Diagnosis not present

## 2019-10-04 DIAGNOSIS — N390 Urinary tract infection, site not specified: Secondary | ICD-10-CM | POA: Diagnosis not present

## 2019-10-04 DIAGNOSIS — E782 Mixed hyperlipidemia: Secondary | ICD-10-CM | POA: Diagnosis not present

## 2019-10-04 DIAGNOSIS — I480 Paroxysmal atrial fibrillation: Secondary | ICD-10-CM | POA: Diagnosis not present

## 2019-10-04 DIAGNOSIS — Z23 Encounter for immunization: Secondary | ICD-10-CM | POA: Diagnosis not present

## 2019-10-04 DIAGNOSIS — Z0000 Encounter for general adult medical examination without abnormal findings: Secondary | ICD-10-CM | POA: Diagnosis not present

## 2019-10-04 DIAGNOSIS — I129 Hypertensive chronic kidney disease with stage 1 through stage 4 chronic kidney disease, or unspecified chronic kidney disease: Secondary | ICD-10-CM | POA: Diagnosis not present

## 2019-11-11 ENCOUNTER — Ambulatory Visit: Payer: Self-pay | Admitting: Cardiology

## 2019-11-11 NOTE — Progress Notes (Deleted)
Patient referred by Merrilee Seashore, MD for ***  Subjective:   [name], female    DOB: 01/28/1924, 85 y.o.   MRN: [id number]   No chief complaint on file.   *** HPI  85 y.o. *** female with ***  *** Past Medical History:  Diagnosis Date  . Femur fracture, left ([place])    a. 10/2013 Acute oblique Fx through te midshaft of the left femur w/ anteromedial displacement, medial angulation, and ~ 10 cm foreshortening/overlap.  Marland Kitchen Hyperlipidemia   . Hypertension   . Osteoarthritis    a. 04/2005 s/p L total hip arthroplasty  . Osteopenia   . Severe aortic stenosis    a. 10/2013 Echo: EF 65-70%, mild conc LVH, Gr 1 DD, sev AS, mild to mod AI, Valve area: 0.89cm^2 (VTI), 0.85cm^2 (Vmax), mildly dil LA, mildly dil RA, PASP 39mg.    *** Past Surgical History:  Procedure Laterality Date  . ABDOMINAL HYSTERECTOMY    . ORIF FEMUR FRACTURE Left 11/22/2013  . ORIF FEMUR FRACTURE Left 11/22/2013   Procedure: OPEN REDUCTION INTERNAL FIXATION (ORIF) DISTAL FEMUR FRACTURE;  Surgeon: MMarybelle Killings MD;  Location: [facility]  Service: Orthopedics;  Laterality: Left;  . TOTAL HIP ARTHROPLASTY Bilateral     *** Social History   Tobacco Use  Smoking Status Never Smoker  Smokeless Tobacco Never Used    Social History   Substance and Sexual Activity  Alcohol Use No    *** Family History  Problem Relation Age of Onset  . Heart attack Mother        MI in her 481's . Stroke Father        died in his 979's    *** Current Outpatient Medications on File Prior to Visit  Medication Sig Dispense Refill  . aspirin EC 325 MG EC tablet Take 1 tablet (325 mg total) by mouth daily with breakfast. (Patient not taking: Reported on 02/02/2018) 30 tablet 0  . aspirin EC 81 MG tablet Take 81 mg by mouth daily.    .Marland Kitchendocusate sodium (COLACE) 100 MG capsule Take 1 capsule (100 mg total) by mouth every 12 (twelve) hours. (Patient taking differently: Take 100 mg by mouth daily. ) 30 capsule 0  .  HYDROcodone-acetaminophen (NORCO) 5-325 MG tablet Take 1 tablet by mouth every 6 (six) hours as needed for severe pain. (Patient not taking: Reported on 08/04/2019) 12 tablet 0  . lisinopril (PRINIVIL,ZESTRIL) 20 MG tablet Take 1 tablet (20 mg total) by mouth daily. (Patient not taking: Reported on 02/02/2018)    . lovastatin (MEVACOR) 40 MG tablet Take 40 mg by mouth daily.     . memantine (NAMENDA) 10 MG tablet Take 10 mg by mouth 2 (two) times daily.    . metoprolol (LOPRESSOR) 50 MG tablet Take 1 tablet (50 mg total) by mouth 2 (two) times daily. (Patient not taking: Reported on 08/04/2019)    . ondansetron (ZOFRAN-ODT) 8 MG disintegrating tablet Take 1 tablet (8 mg total) by mouth every 8 (eight) hours as needed for nausea or vomiting. (Patient not taking: Reported on 02/02/2018) 10 tablet 0  . polyethylene glycol (MIRALAX / GLYCOLAX) packet Take 17 g by mouth daily as needed for mild constipation. (Patient not taking: Reported on 02/02/2018) 14 each 0   No current facility-administered medications on file prior to visit.    Cardiovascular and other pertinent studies:   EKG 08/04/2019:    Recent labs: 08/04/2019: Glucose 116, BUN/Cr 19/0.96. EGFR 59.  Na/K 140/4.9. Rest of the CMP--- Anion Gap: 10 Hemo 10.8. MCV 97.4. Platelets 94      *** ROS      *** There were no vitals filed for this visit.   There is no height or weight on file to calculate BMI. There were no vitals filed for this visit.  *** Objective:   Physical Exam    ***     Assessment & Recommendations:   ***  ***     Thank you for referring the patient to us. Please feel free to contact with any questions.  Manish J Patwardhan, MD [facility] Pager: [phone] Office: [phone]  

## 2019-11-13 ENCOUNTER — Telehealth: Payer: Self-pay

## 2020-04-10 DIAGNOSIS — I129 Hypertensive chronic kidney disease with stage 1 through stage 4 chronic kidney disease, or unspecified chronic kidney disease: Secondary | ICD-10-CM | POA: Diagnosis not present

## 2020-04-10 DIAGNOSIS — I10 Essential (primary) hypertension: Secondary | ICD-10-CM | POA: Diagnosis not present

## 2020-04-10 DIAGNOSIS — D696 Thrombocytopenia, unspecified: Secondary | ICD-10-CM | POA: Diagnosis not present

## 2020-04-10 DIAGNOSIS — Z0000 Encounter for general adult medical examination without abnormal findings: Secondary | ICD-10-CM | POA: Diagnosis not present

## 2020-04-10 DIAGNOSIS — E782 Mixed hyperlipidemia: Secondary | ICD-10-CM | POA: Diagnosis not present

## 2020-04-23 DIAGNOSIS — E782 Mixed hyperlipidemia: Secondary | ICD-10-CM | POA: Diagnosis not present

## 2020-04-23 DIAGNOSIS — I129 Hypertensive chronic kidney disease with stage 1 through stage 4 chronic kidney disease, or unspecified chronic kidney disease: Secondary | ICD-10-CM | POA: Diagnosis not present

## 2020-04-23 DIAGNOSIS — I10 Essential (primary) hypertension: Secondary | ICD-10-CM | POA: Diagnosis not present

## 2020-04-23 DIAGNOSIS — I359 Nonrheumatic aortic valve disorder, unspecified: Secondary | ICD-10-CM | POA: Diagnosis not present

## 2020-05-15 NOTE — Progress Notes (Signed)
Patient referred by Sarah Seashore, MD for aortic stenosis  Subjective:   [name], female    DOB: May 02, 1924, 86 y.o.   MRN: [id number]   Chief Complaint  Patient presents with  . Aortic Stenosis  . New Patient (Initial Visit)     HPI  86 y.o. African American female with hypertension, hyperlipidemia, severe aortic stenosis.  Patient was found to have severe aortic stenosis (AVA 0.85 cm2) on echocardiogram in 2014 during hospital admission for femur fracture. She has not seen cardiology since then. Remarkably, she has done fairly well.She lives independently until September 2020, is when she moved in with her daughter [name], who is here with her today. There has been decline in her mobility recently, but still able to walk with a walker. She mostly walks indoors and rarely goes out.  She is able to bath and dress herself, but does not do any cooking or cleaning. With her limited level of activity, she denies chest pain, shortness of breath, palpitations, leg edema, orthopnea, PND, TIA/syncope. Daughter mentions that she has issues with recent memory and has been on Namenda.    Past Medical History:  Diagnosis Date  . Femur fracture, left ([place])    a. 10/2013 Acute oblique Fx through te midshaft of the left femur w/ anteromedial displacement, medial angulation, and ~ 10 cm foreshortening/overlap.  Marland Kitchen Hyperlipidemia   . Hypertension   . Osteoarthritis    a. 04/2005 s/p L total hip arthroplasty  . Osteopenia   . Severe aortic stenosis    a. 10/2013 Echo: EF 65-70%, mild conc LVH, Gr 1 DD, sev AS, mild to mod AI, Valve area: 0.89cm^2 (VTI), 0.85cm^2 (Vmax), mildly dil LA, mildly dil RA, PASP 96mg.     Past Surgical History:  Procedure Laterality Date  . ABDOMINAL HYSTERECTOMY    . ORIF FEMUR FRACTURE Left 11/22/2013  . ORIF FEMUR FRACTURE Left 11/22/2013   Procedure: OPEN REDUCTION INTERNAL FIXATION (ORIF) DISTAL FEMUR FRACTURE;  Surgeon: MMarybelle Killings MD;  [facility]  Service: Orthopedics;  Laterality: Left;  . TOTAL HIP ARTHROPLASTY Bilateral      Social History   Tobacco Use  Smoking Status Never Smoker  Smokeless Tobacco Never Used    Social History   Substance and Sexual Activity  Alcohol Use No     Family History  Problem Relation Age of Onset  . Heart attack Mother        MI in her 469's . Stroke Father        died in his 936's     Current Outpatient Medications on File Prior to Visit  Medication Sig Dispense Refill  . memantine (NAMENDA) 10 MG tablet Take 10 mg by mouth 2 (two) times daily.     No current facility-administered medications on file prior to visit.    Cardiovascular and other pertinent studies:  EKG 05/18/2020: Sinus rhythm 80 bpm Left ventricular hypertrophy   Echocardiogram 2014: - Left ventricle: The cavity size was normal. There was  moderate focal basal and mild concentric hypertrophy of  the septum. Systolic function was vigorous. The estimated  ejection fraction was in the range of 65% to 70%. There  was no dynamic obstruction. Wall motion was normal; there  were no regional wall motion abnormalities. Doppler  parameters are consistent with abnormal left ventricular  relaxation (grade 1 diastolic dysfunction). Doppler  parameters are consistent with high ventricular filling  pressure.  - Aortic valve: Cusp separation was  moderately reduced  (mostly fixed non coronary cusp). There was severe  stenosis. Mild to moderate regurgitation. Peak velocity:  406cm/s (S). Valve area: 0.89cm^2(VTI). Valve area:  0.85cm^2 (Vmax).  - Mitral valve: Calcified annulus.  - Left atrium: The atrium was mildly dilated.  - Right ventricle: The cavity size was mildly dilated. Wall  thickness was normal.  - Right atrium: The atrium was mildly dilated.  - Pulmonary arteries: Systolic pressure was mildly to  moderately increased. PA peak pressure: 74m Hg (S).  Impressions:    - When compared to 2010, aortic stenosis has progressed  (previously moderate).    Recent labs: 04/10/2020: Glucose 96, BUN/Cr 19/0.98. EGFR 57. Na/K 146/4.1.  HbA1C N/A Chol 152, TG 63, HDL 100, LDL 39 TSH 1.5 normal (09/2019)   Review of Systems  Cardiovascular: Negative for chest pain, dyspnea on exertion, leg swelling, palpitations and syncope.        Vitals:   05/18/20 1111  BP: (!) 145/65  Pulse: 79  Resp: 17  SpO2: 96%     Body mass index is 16.64 kg/m. Filed Weights   05/18/20 1111  Weight: 100 lb (45.4 kg)     Objective:   Physical Exam Vitals and nursing note reviewed.  Constitutional:      General: She is not in acute distress.    Appearance: She is underweight.  Neck:     Vascular: No JVD.  Cardiovascular:     Rate and Rhythm: Normal rate and regular rhythm.     Pulses: Intact distal pulses.     Heart sounds: Murmur heard.  Harsh midsystolic murmur is present with a grade of 3/6 at the upper right sternal border radiating to the neck.   Pulmonary:     Effort: Pulmonary effort is normal.     Breath sounds: Normal breath sounds. No wheezing or rales.         Assessment & Recommendations:   86y.o. African American female with hypertension, hyperlipidemia, severe aortic stenosis.  Severe aortic stenosis: Known severe AS dating back at the earliest to 2014. She has defied odds and mortality expectations, and even more remarkably, staeyd asymptomatic. I do not think TAVR would make any meaningful improvement in her quality of life, which very acceptable for her age. Also, dementia would likely preclude more invasive workup and management. I will obtain an echocardiogram, more out of academic interest, to assess natural history of her AS. Okay not to be on lipid lowering therapy. Not on any antihypertensive at baseline. Will defer to PCP.  I have encouraged the patient and her daughter to initiate advanced directive  initiation.     Thank you for referring the [place] to [place] Please feel free to contact with any questions.  MNigel Mormon MD [facility] Pager: [phone]: [phone]

## 2020-05-18 ENCOUNTER — Ambulatory Visit: Payer: Medicare Other | Admitting: Cardiology

## 2020-05-18 ENCOUNTER — Encounter: Payer: Self-pay | Admitting: Cardiology

## 2020-05-18 ENCOUNTER — Other Ambulatory Visit: Payer: Self-pay

## 2020-05-18 VITALS — BP 145/65 | HR 79 | Resp 17 | Ht 65.0 in | Wt 100.0 lb

## 2020-05-18 DIAGNOSIS — I350 Nonrheumatic aortic (valve) stenosis: Secondary | ICD-10-CM

## 2020-05-25 ENCOUNTER — Other Ambulatory Visit: Payer: Medicare Other

## 2020-07-01 DIAGNOSIS — S90821A Blister (nonthermal), right foot, initial encounter: Secondary | ICD-10-CM | POA: Diagnosis not present

## 2020-07-01 DIAGNOSIS — R609 Edema, unspecified: Secondary | ICD-10-CM | POA: Diagnosis not present

## 2020-07-01 DIAGNOSIS — Z7409 Other reduced mobility: Secondary | ICD-10-CM | POA: Diagnosis not present

## 2020-07-01 DIAGNOSIS — S90822A Blister (nonthermal), left foot, initial encounter: Secondary | ICD-10-CM | POA: Diagnosis not present

## 2020-07-07 DIAGNOSIS — R600 Localized edema: Secondary | ICD-10-CM | POA: Diagnosis not present

## 2020-07-07 DIAGNOSIS — S90822D Blister (nonthermal), left foot, subsequent encounter: Secondary | ICD-10-CM | POA: Diagnosis not present

## 2020-07-07 DIAGNOSIS — M150 Primary generalized (osteo)arthritis: Secondary | ICD-10-CM | POA: Diagnosis not present

## 2020-07-07 DIAGNOSIS — Z7409 Other reduced mobility: Secondary | ICD-10-CM | POA: Diagnosis not present

## 2020-07-07 DIAGNOSIS — L89301 Pressure ulcer of unspecified buttock, stage 1: Secondary | ICD-10-CM | POA: Diagnosis not present

## 2020-07-07 DIAGNOSIS — S90821D Blister (nonthermal), right foot, subsequent encounter: Secondary | ICD-10-CM | POA: Diagnosis not present

## 2020-07-09 DIAGNOSIS — L89301 Pressure ulcer of unspecified buttock, stage 1: Secondary | ICD-10-CM | POA: Diagnosis not present

## 2020-07-09 DIAGNOSIS — M150 Primary generalized (osteo)arthritis: Secondary | ICD-10-CM | POA: Diagnosis not present

## 2020-07-09 DIAGNOSIS — R600 Localized edema: Secondary | ICD-10-CM | POA: Diagnosis not present

## 2020-07-09 DIAGNOSIS — S90822D Blister (nonthermal), left foot, subsequent encounter: Secondary | ICD-10-CM | POA: Diagnosis not present

## 2020-07-09 DIAGNOSIS — Z7409 Other reduced mobility: Secondary | ICD-10-CM | POA: Diagnosis not present

## 2020-07-09 DIAGNOSIS — S90821D Blister (nonthermal), right foot, subsequent encounter: Secondary | ICD-10-CM | POA: Diagnosis not present

## 2020-07-10 DIAGNOSIS — R600 Localized edema: Secondary | ICD-10-CM | POA: Diagnosis not present

## 2020-07-10 DIAGNOSIS — M150 Primary generalized (osteo)arthritis: Secondary | ICD-10-CM | POA: Diagnosis not present

## 2020-07-10 DIAGNOSIS — L89301 Pressure ulcer of unspecified buttock, stage 1: Secondary | ICD-10-CM | POA: Diagnosis not present

## 2020-07-10 DIAGNOSIS — Z7409 Other reduced mobility: Secondary | ICD-10-CM | POA: Diagnosis not present

## 2020-07-10 DIAGNOSIS — S90821D Blister (nonthermal), right foot, subsequent encounter: Secondary | ICD-10-CM | POA: Diagnosis not present

## 2020-07-10 DIAGNOSIS — S90822D Blister (nonthermal), left foot, subsequent encounter: Secondary | ICD-10-CM | POA: Diagnosis not present

## 2020-07-13 DIAGNOSIS — Z7409 Other reduced mobility: Secondary | ICD-10-CM | POA: Diagnosis not present

## 2020-07-13 DIAGNOSIS — S90822D Blister (nonthermal), left foot, subsequent encounter: Secondary | ICD-10-CM | POA: Diagnosis not present

## 2020-07-13 DIAGNOSIS — L89301 Pressure ulcer of unspecified buttock, stage 1: Secondary | ICD-10-CM | POA: Diagnosis not present

## 2020-07-13 DIAGNOSIS — M150 Primary generalized (osteo)arthritis: Secondary | ICD-10-CM | POA: Diagnosis not present

## 2020-07-13 DIAGNOSIS — S90821D Blister (nonthermal), right foot, subsequent encounter: Secondary | ICD-10-CM | POA: Diagnosis not present

## 2020-07-13 DIAGNOSIS — R600 Localized edema: Secondary | ICD-10-CM | POA: Diagnosis not present

## 2020-07-14 ENCOUNTER — Encounter (HOSPITAL_COMMUNITY): Payer: Self-pay | Admitting: Emergency Medicine

## 2020-07-14 ENCOUNTER — Emergency Department (HOSPITAL_COMMUNITY): Payer: Medicare Other

## 2020-07-14 ENCOUNTER — Emergency Department (HOSPITAL_COMMUNITY)
Admission: EM | Admit: 2020-07-14 | Discharge: 2020-07-29 | Disposition: E | Payer: Medicare Other | Attending: Emergency Medicine | Admitting: Emergency Medicine

## 2020-07-14 ENCOUNTER — Other Ambulatory Visit: Payer: Self-pay

## 2020-07-14 DIAGNOSIS — Z515 Encounter for palliative care: Secondary | ICD-10-CM | POA: Diagnosis not present

## 2020-07-14 DIAGNOSIS — Z7409 Other reduced mobility: Secondary | ICD-10-CM | POA: Diagnosis not present

## 2020-07-14 DIAGNOSIS — Z96641 Presence of right artificial hip joint: Secondary | ICD-10-CM | POA: Insufficient documentation

## 2020-07-14 DIAGNOSIS — R627 Adult failure to thrive: Secondary | ICD-10-CM | POA: Insufficient documentation

## 2020-07-14 DIAGNOSIS — I10 Essential (primary) hypertension: Secondary | ICD-10-CM | POA: Diagnosis not present

## 2020-07-14 DIAGNOSIS — F0390 Unspecified dementia without behavioral disturbance: Secondary | ICD-10-CM | POA: Diagnosis not present

## 2020-07-14 DIAGNOSIS — R600 Localized edema: Secondary | ICD-10-CM | POA: Diagnosis not present

## 2020-07-14 DIAGNOSIS — Z66 Do not resuscitate: Secondary | ICD-10-CM | POA: Diagnosis not present

## 2020-07-14 DIAGNOSIS — Z96642 Presence of left artificial hip joint: Secondary | ICD-10-CM | POA: Insufficient documentation

## 2020-07-14 DIAGNOSIS — I517 Cardiomegaly: Secondary | ICD-10-CM | POA: Diagnosis not present

## 2020-07-14 DIAGNOSIS — I499 Cardiac arrhythmia, unspecified: Secondary | ICD-10-CM | POA: Diagnosis not present

## 2020-07-14 DIAGNOSIS — I7090 Unspecified atherosclerosis: Secondary | ICD-10-CM | POA: Diagnosis not present

## 2020-07-14 DIAGNOSIS — J90 Pleural effusion, not elsewhere classified: Secondary | ICD-10-CM | POA: Diagnosis not present

## 2020-07-14 DIAGNOSIS — S90821D Blister (nonthermal), right foot, subsequent encounter: Secondary | ICD-10-CM | POA: Diagnosis not present

## 2020-07-14 DIAGNOSIS — R0902 Hypoxemia: Secondary | ICD-10-CM | POA: Diagnosis not present

## 2020-07-14 DIAGNOSIS — I6782 Cerebral ischemia: Secondary | ICD-10-CM | POA: Diagnosis not present

## 2020-07-14 DIAGNOSIS — Z743 Need for continuous supervision: Secondary | ICD-10-CM | POA: Diagnosis not present

## 2020-07-14 DIAGNOSIS — R0689 Other abnormalities of breathing: Secondary | ICD-10-CM | POA: Diagnosis not present

## 2020-07-14 DIAGNOSIS — L89301 Pressure ulcer of unspecified buttock, stage 1: Secondary | ICD-10-CM | POA: Diagnosis not present

## 2020-07-14 DIAGNOSIS — Z20822 Contact with and (suspected) exposure to covid-19: Secondary | ICD-10-CM | POA: Diagnosis not present

## 2020-07-14 DIAGNOSIS — I4891 Unspecified atrial fibrillation: Secondary | ICD-10-CM | POA: Diagnosis not present

## 2020-07-14 DIAGNOSIS — J189 Pneumonia, unspecified organism: Secondary | ICD-10-CM | POA: Diagnosis not present

## 2020-07-14 DIAGNOSIS — G319 Degenerative disease of nervous system, unspecified: Secondary | ICD-10-CM | POA: Diagnosis not present

## 2020-07-14 DIAGNOSIS — Z7189 Other specified counseling: Secondary | ICD-10-CM

## 2020-07-14 DIAGNOSIS — S90822D Blister (nonthermal), left foot, subsequent encounter: Secondary | ICD-10-CM | POA: Diagnosis not present

## 2020-07-14 DIAGNOSIS — R6251 Failure to thrive (child): Secondary | ICD-10-CM | POA: Diagnosis not present

## 2020-07-14 DIAGNOSIS — M150 Primary generalized (osteo)arthritis: Secondary | ICD-10-CM | POA: Diagnosis not present

## 2020-07-14 LAB — CBC WITH DIFFERENTIAL/PLATELET
Abs Immature Granulocytes: 0.04 10*3/uL (ref 0.00–0.07)
Basophils Absolute: 0 10*3/uL (ref 0.0–0.1)
Basophils Relative: 0 %
Eosinophils Absolute: 0 10*3/uL (ref 0.0–0.5)
Eosinophils Relative: 0 %
HCT: 32.9 % — ABNORMAL LOW (ref 36.0–46.0)
Hemoglobin: 10.1 g/dL — ABNORMAL LOW (ref 12.0–15.0)
Immature Granulocytes: 1 %
Lymphocytes Relative: 8 %
Lymphs Abs: 0.6 10*3/uL — ABNORMAL LOW (ref 0.7–4.0)
MCH: 32.3 pg (ref 26.0–34.0)
MCHC: 30.7 g/dL (ref 30.0–36.0)
MCV: 105.1 fL — ABNORMAL HIGH (ref 80.0–100.0)
Monocytes Absolute: 0.2 10*3/uL (ref 0.1–1.0)
Monocytes Relative: 3 %
Neutro Abs: 7 10*3/uL (ref 1.7–7.7)
Neutrophils Relative %: 88 %
Platelets: 191 10*3/uL (ref 150–400)
RBC: 3.13 MIL/uL — ABNORMAL LOW (ref 3.87–5.11)
RDW: 18.9 % — ABNORMAL HIGH (ref 11.5–15.5)
WBC: 7.9 10*3/uL (ref 4.0–10.5)
nRBC: 0.9 % — ABNORMAL HIGH (ref 0.0–0.2)

## 2020-07-14 LAB — ALBUMIN: Albumin: 2.4 g/dL — ABNORMAL LOW (ref 3.5–5.0)

## 2020-07-14 LAB — COMPREHENSIVE METABOLIC PANEL
ALT: 24 U/L (ref 0–44)
AST: 33 U/L (ref 15–41)
Albumin: 2.4 g/dL — ABNORMAL LOW (ref 3.5–5.0)
Alkaline Phosphatase: 95 U/L (ref 38–126)
Anion gap: 8 (ref 5–15)
BUN: 28 mg/dL — ABNORMAL HIGH (ref 8–23)
CO2: 26 mmol/L (ref 22–32)
Calcium: 9.1 mg/dL (ref 8.9–10.3)
Chloride: 116 mmol/L — ABNORMAL HIGH (ref 98–111)
Creatinine, Ser: 1.1 mg/dL — ABNORMAL HIGH (ref 0.44–1.00)
GFR calc Af Amer: 50 mL/min — ABNORMAL LOW (ref >60)
GFR calc non Af Amer: 43 mL/min — ABNORMAL LOW (ref >60)
Glucose, Bld: 113 mg/dL — ABNORMAL HIGH (ref 70–99)
Potassium: 4.7 mmol/L (ref 3.5–5.1)
Sodium: 150 mmol/L — ABNORMAL HIGH (ref 135–145)
Total Bilirubin: 0.9 mg/dL (ref 0.3–1.2)
Total Protein: 5.7 g/dL — ABNORMAL LOW (ref 6.5–8.1)

## 2020-07-14 LAB — BRAIN NATRIURETIC PEPTIDE: B Natriuretic Peptide: 1682.4 pg/mL — ABNORMAL HIGH (ref 0.0–100.0)

## 2020-07-14 LAB — SARS CORONAVIRUS 2 BY RT PCR (HOSPITAL ORDER, PERFORMED IN ~~LOC~~ HOSPITAL LAB): SARS Coronavirus 2: NEGATIVE

## 2020-07-14 LAB — URINALYSIS, ROUTINE W REFLEX MICROSCOPIC
Bilirubin Urine: NEGATIVE
Glucose, UA: NEGATIVE mg/dL
Ketones, ur: NEGATIVE mg/dL
Nitrite: NEGATIVE
Protein, ur: 100 mg/dL — AB
Specific Gravity, Urine: 1.017 (ref 1.005–1.030)
WBC, UA: >50 WBC/hpf — ABNORMAL HIGH (ref 0–5)
pH: 5 (ref 5.0–8.0)

## 2020-07-14 LAB — LACTIC ACID, PLASMA: Lactic Acid, Venous: 1.7 mmol/L (ref 0.5–1.9)

## 2020-07-14 MED ORDER — HYDROMORPHONE HCL 1 MG/ML IJ SOLN
0.5000 mg | INTRAMUSCULAR | Status: DC | PRN
  Administered 2020-07-14: 0.5 mg via INTRAVENOUS
  Filled 2020-07-14: qty 1

## 2020-07-14 MED ORDER — SODIUM CHLORIDE 0.9 % IV SOLN: Freq: Once | INTRAVENOUS | Status: DC

## 2020-07-14 MED ORDER — SODIUM CHLORIDE 0.9 % IV SOLN
500.0000 mg | Freq: Once | INTRAVENOUS | Status: AC
  Administered 2020-07-14: 500 mg via INTRAVENOUS
  Filled 2020-07-14: qty 500

## 2020-07-14 MED ORDER — SODIUM CHLORIDE 0.9 % IV SOLN
1000.0000 mL | INTRAVENOUS | Status: DC
  Administered 2020-07-14 (×2): 1000 mL via INTRAVENOUS

## 2020-07-14 MED ORDER — POLYVINYL ALCOHOL 1.4 % OP SOLN
1.0000 [drp] | Freq: Four times a day (QID) | OPHTHALMIC | Status: DC | PRN
  Filled 2020-07-14: qty 15

## 2020-07-14 MED ORDER — BIOTENE DRY MOUTH MT LIQD: 15.0000 mL | OROMUCOSAL | Status: DC | PRN

## 2020-07-14 MED ORDER — LORAZEPAM 1 MG PO TABS: 1.0000 mg | ORAL_TABLET | ORAL | Status: DC | PRN

## 2020-07-14 MED ORDER — GLYCOPYRROLATE 0.2 MG/ML IJ SOLN: 0.2000 mg | INTRAMUSCULAR | Status: DC | PRN

## 2020-07-14 MED ORDER — ACETAMINOPHEN 325 MG PO TABS: 650.0000 mg | ORAL_TABLET | Freq: Four times a day (QID) | ORAL | Status: DC | PRN

## 2020-07-14 MED ORDER — SODIUM CHLORIDE 0.9 % IV BOLUS (SEPSIS)
1000.0000 mL | Freq: Once | INTRAVENOUS | Status: DC
  Administered 2020-07-14: 1000 mL via INTRAVENOUS

## 2020-07-14 MED ORDER — LORAZEPAM 2 MG/ML PO CONC: 1.0000 mg | ORAL | Status: DC | PRN

## 2020-07-14 MED ORDER — GLYCOPYRROLATE 1 MG PO TABS
1.0000 mg | ORAL_TABLET | ORAL | Status: DC | PRN
  Filled 2020-07-14: qty 1

## 2020-07-14 MED ORDER — ONDANSETRON HCL 4 MG/2ML IJ SOLN: 4.0000 mg | Freq: Four times a day (QID) | INTRAMUSCULAR | Status: DC | PRN

## 2020-07-14 MED ORDER — ONDANSETRON 4 MG PO TBDP: 4.0000 mg | ORAL_TABLET | Freq: Four times a day (QID) | ORAL | Status: DC | PRN

## 2020-07-14 MED ORDER — LORAZEPAM 2 MG/ML IJ SOLN
1.0000 mg | INTRAMUSCULAR | Status: DC | PRN
  Filled 2020-07-14: qty 1

## 2020-07-14 MED ORDER — SODIUM CHLORIDE 0.9 % IV SOLN
1.0000 g | Freq: Once | INTRAVENOUS | Status: AC
  Administered 2020-07-14: 1 g via INTRAVENOUS
  Filled 2020-07-14: qty 10

## 2020-07-14 MED ORDER — ACETAMINOPHEN 650 MG RE SUPP: 650.0000 mg | Freq: Four times a day (QID) | RECTAL | Status: DC | PRN

## 2020-07-14 NOTE — ED Provider Notes (Signed)
[facility] EMERGENCY DEPARTMENT Provider Note   CSN: [id number] Arrival date & time: 07/22/2020  1433     History Chief Complaint  Patient presents with  . Failure To Thrive    [name] is a 86 y.o. female.  HPI  LEVEL 5 CAVEAT 2/2 to AMS/DEMENTIA 86 year old female with a history of hypertension, hyperlipidemia, paroxysmal A. fib, dementia presents to the ER with several days of poor p.o. intake, worsening confusion.  Patient lives with her daughter at home.  Daughter states that she has been moaning more over the last few days, not as interactive as normal.  Can eat, answer questions at baseline.  Per EMS, wound care nurse came to the house today to change her wounds, found her to be more confused, covered in cold feces and with a sacral wound dressing which had appeared not to be changed for several weeks.  Wound care nurse called EMS.  Daughter has not noted any fevers, patient has not reported any urinary complaints.  Daughter did report that the wound care nurses thought that she was having "rattling in her chest".  Daughter states that she has not been giving the patient Lasix since the patient has not been eating or drinking, however I do not see a CHF diagnosis or Lasix on her medication list. Not vaccinated for COVID however the patient has not been out of the house in several months.    Past Medical History:  Diagnosis Date  . Femur fracture, left (HCC)    a. 10/2013 Acute oblique Fx through te midshaft of the left femur w/ anteromedial displacement, medial angulation, and ~ 10 cm foreshortening/overlap.  Marland Kitchen Hyperlipidemia   . Hypertension   . Osteoarthritis    a. 04/2005 s/p L total hip arthroplasty  . Osteopenia   . Severe aortic stenosis    a. 10/2013 Echo: EF 65-70%, mild conc LVH, Gr 1 DD, sev AS, mild to mod AI, Valve area: 0.89cm^2 (VTI), 0.85cm^2 (Vmax), mildly dil LA, mildly dil RA, PASP .    Patient Active Problem List   Diagnosis Date  Noted  . Acute blood loss anemia 11/22/2013  . Severe aortic stenosis 11/21/2013  . Closed displaced oblique fracture of shaft of left femur (HCC) 11/20/2013  . HTN (hypertension) 11/20/2013  . Systolic murmur 11/20/2013  . Hypercholesterolemia 11/20/2013  . Fracture, femur, shaft (HCC) 11/20/2013    Past Surgical History:  Procedure Laterality Date  . ABDOMINAL HYSTERECTOMY    . ORIF FEMUR FRACTURE Left 11/22/2013  . ORIF FEMUR FRACTURE Left 11/22/2013   Procedure: OPEN REDUCTION INTERNAL FIXATION (ORIF) DISTAL FEMUR FRACTURE;  Surgeon: Eldred Manges, MD;  Location: [facility] OR;  Service: Orthopedics;  Laterality: Left;  . TOTAL HIP ARTHROPLASTY Bilateral      OB History   No obstetric history on file.     Family History  Problem Relation Age of Onset  . Heart attack Mother        MI in her 58's  . Stroke Father        died in his 21's.  . Kidney failure Sister     Social History   Tobacco Use  . Smoking status: Never Smoker  . Smokeless tobacco: Never Used  Vaping Use  . Vaping Use: Never used  Substance Use Topics  . Alcohol use: No  . Drug use: No    Home Medications Prior to Admission medications   Medication Sig Start Date End Date Taking? Authorizing Provider  memantine (NAMENDA) 10 MG tablet Take 10 mg by mouth 2 (two) times daily. 06/16/19   [provider]    Allergies    Patient has no known allergies.  Review of Systems   Review of Systems  Unable to perform ROS: Dementia    Physical Exam Updated Vital Signs BP 140/63   Pulse 84   Temp (!) 96.1 F (35.6 C) (Rectal)   Resp (!) 25   Ht 5\' 5"  (1.651 m)   Wt 45.4 kg   SpO2 100%   BMI 16.66 kg/m   Physical Exam Vitals and nursing note reviewed.  Constitutional:      General: She is not in acute distress.    Appearance: She is well-developed. She is ill-appearing (Chronically ill appearing).     Comments: Cachectic,, not responding to questions, opening her eyes spontaneously,  responds to painful stimuli  HENT:     Head: Normocephalic and atraumatic.  Eyes:     Conjunctiva/sclera: Conjunctivae normal.  Cardiovascular:     Rate and Rhythm: Normal rate. Rhythm irregular.     Pulses: Normal pulses.     Heart sounds: No murmur heard.   Pulmonary:     Effort: Pulmonary effort is normal. No respiratory distress.     Breath sounds: Normal breath sounds.     Comments: Decreased breathe sounds Abdominal:     Palpations: Abdomen is soft.     Tenderness: There is no abdominal tenderness.  Musculoskeletal:     Cervical back: Neck supple.  Skin:    General: Skin is warm and dry.     Findings: No bruising.     Comments: Patient with sacral wound, chronic wounds to the dorsal aspect of her feet bilaterally  Neurological:     Mental Status: She is alert.     Comments: Moving all 4 extremities spontaneously         ED Results / Procedures / Treatments   Labs (all labs ordered are listed, but only abnormal results are displayed) Labs Reviewed  CBC WITH DIFFERENTIAL/PLATELET - Abnormal; Notable for the following components:      Result Value   RBC 3.13 (*)    Hemoglobin 10.1 (*)    HCT 32.9 (*)    MCV 105.1 (*)    RDW 18.9 (*)    nRBC 0.9 (*)    Lymphs Abs 0.6 (*)    All other components within normal limits  COMPREHENSIVE METABOLIC PANEL - Abnormal; Notable for the following components:   Sodium 150 (*)    Chloride 116 (*)    Glucose, Bld 113 (*)    BUN 28 (*)    Creatinine, Ser 1.10 (*)    Total Protein 5.7 (*)    Albumin 2.4 (*)    GFR calc non Af Amer 43 (*)    GFR calc Af Amer 50 (*)    All other components within normal limits  BRAIN NATRIURETIC PEPTIDE - Abnormal; Notable for the following components:   B Natriuretic Peptide 1,682.4 (*)    All other components within normal limits  ALBUMIN - Abnormal; Notable for the following components:   Albumin 2.4 (*)    All other components within normal limits  SARS CORONAVIRUS 2 BY RT PCR  (HOSPITAL ORDER, PERFORMED IN [facility])  CULTURE, BLOOD (ROUTINE X 2)  CULTURE, BLOOD (ROUTINE X 2)  LACTIC ACID, PLASMA  URINALYSIS, ROUTINE W REFLEX MICROSCOPIC  LACTIC ACID, PLASMA    EKG EKG Interpretation  Date/Time:  Tuesday Jul 28, 2020 15:15:07 EDT Ventricular Rate:  93 PR Interval:    QRS Duration: 99 QT Interval:  375 QTC Calculation: 467 R Axis:   26 Text Interpretation: Atrial fibrillation Artifact Abnormal ECG Confirmed by Gerhard Munch [phone]) on 07/28/2020 3:49:38 PM   Radiology CT Head Wo Contrast  Result Date: 07/28/20 CLINICAL DATA:  Mental status change, unknown cause. Altered mental status EXAM: CT HEAD WITHOUT CONTRAST TECHNIQUE: Contiguous axial images were obtained from the base of the skull through the vertex without intravenous contrast. COMPARISON:  Head CT 08/04/2019 FINDINGS: Brain: The examination is significantly motion degraded, limiting evaluation. Moderate generalized parenchymal atrophy. Moderate multifocal hypoattenuation within the cerebral white matter is nonspecific, but consistent with chronic small vessel ischemic disease. Findings have not appreciably changed as compared to the prior head CT of 08/04/2019. No acute intracranial hemorrhage is identified. No convincing cortical infarct is identified. No extra-axial fluid collection is identified. No evidence of intracranial mass. No midline shift. Vascular: No hyperdense vessel.  Atherosclerotic calcifications. Skull: No fracture or focal suspicious osseous lesion identified. Sinuses/Orbits: No evidence of acute orbital abnormality. No significant paranasal sinus disease or mastoid effusion at the imaged levels. IMPRESSION: Motion degradation significantly limits evaluation. Within this limitation, no acute intracranial abnormality is identified. Moderate generalized parenchymal atrophy and chronic small vessel ischemic disease, similar in appearance as compared to the head CT of  08/04/2019. Electronically Signed   By: Jackey Loge DO   On: 28-Jul-2020 16:26   DG Chest Portable 1 View  Result Date: 07-28-20 CLINICAL DATA:  Failure to thrive EXAM: PORTABLE CHEST 1 VIEW COMPARISON:  08/04/2019 FINDINGS: Patchy density bilaterally superimposed on chronic interstitial prominence. Small pleural effusions. No pneumothorax. Mild cardiomegaly. IMPRESSION: Patchy density superimposed on chronic interstitial prominence likely reflects edema given mild cardiomegaly and small pleural effusions. Alternatively, may reflect pneumonia in the appropriate setting. Electronically Signed   By: Guadlupe Spanish M.D.   On: 07/28/20 16:08    Procedures Procedures (including critical care time)  Medications Ordered in ED Medications  HYDROmorphone (DILAUDID) injection 0.5 mg (has no administration in time range)  acetaminophen (TYLENOL) tablet 650 mg (has no administration in time range)    Or  acetaminophen (TYLENOL) suppository 650 mg (has no administration in time range)  ondansetron (ZOFRAN-ODT) disintegrating tablet 4 mg (has no administration in time range)    Or  ondansetron (ZOFRAN) injection 4 mg (has no administration in time range)  glycopyrrolate (ROBINUL) tablet 1 mg (has no administration in time range)    Or  glycopyrrolate (ROBINUL) injection 0.2 mg (has no administration in time range)    Or  glycopyrrolate (ROBINUL) injection 0.2 mg (has no administration in time range)  antiseptic oral rinse (BIOTENE) solution 15 mL (has no administration in time range)  polyvinyl alcohol (LIQUIFILM TEARS) 1.4 % ophthalmic solution 1 drop (has no administration in time range)  LORazepam (ATIVAN) tablet 1 mg (has no administration in time range)    Or  LORazepam (ATIVAN) 2 MG/ML concentrated solution 1 mg (has no administration in time range)    Or  LORazepam (ATIVAN) injection 1 mg (has no administration in time range)  0.9 %  sodium chloride infusion (has no administration in time  range)  cefTRIAXone (ROCEPHIN) 1 g in sodium chloride 0.9 % 100 mL IVPB (1 g Intravenous New Bag/Given 2020/07/28 1714)  azithromycin (ZITHROMAX) 500 mg in sodium chloride 0.9 % 250 mL IVPB (500 mg Intravenous New Bag/Given 28-Jul-2020 1717)    ED Course  I have reviewed the triage vital signs and the nursing notes.  Pertinent labs & imaging results that were available during my care of the patient were reviewed by me and considered in my medical decision making (see chart for details).    MDM Rules/Calculators/A&P                         86 year old female presents to the ER with failure to thrive On presentation, she is currently cachectic looking, moaning, however in her respiratory distress.  Not answering questions, which per daughter is a deviation from her baseline.Patient was found to be hypothermic with a rectal temperature of 96.1, however other vitals reassuring.  Does not meet sepsis criteria at this time.  CBC, UA, blood cultures, lactic, Covid, BNP and CMP, DG chest, EKG. EKG with a-fib without RVR.  Transition of care and palliative care consulted. Palliative care saw the patient, daughter is amenable to hospice and comfort care.    I personally reviewed and interpreted her lab work CMP with hyponatremia of 150, elevated creatinine of 1.18 BUN of 28.  Albumin of 2.4 consistent in the setting of failure to thrive.  Patient was started on maintenance fluids. BNP of 1682 with no previous CHF diagnosis. Last visible echo from 2014 with an unknown EF. Normal lactic acid at 1.7.  COVID negative.  CBC without leukocytosis.  Hemoglobin of 10.1 which appears to be stable.  CT of the head without any acute abnormalities.  Chest x-ray with chronic interstitial prominence, with questionable pneumonia.  Will treat with Rocephin and azithromycin for CAP. UA pending.     Palliative care arranged for hospice care for the patient as of tomorrow at  [facility].  Patient will remain a border here in the ER  until tomorrow morning. DNR status per daughter.   Care was discussed with Dr. Jeraldine Loots and Dr. Charm Barges.    Final Clinical Impression(s) / ED Diagnoses Final diagnoses:  Failure to thrive in adult    Rx / DC Orders ED Discharge Orders    None       Leone Brand 07/06/2020 1825    Gerhard Munch, MD 2020-07-29 1017

## 2020-07-14 NOTE — Progress Notes (Signed)
Civil engineer, contracting [employer] [facility] Hospital Liaison note.    Received request from [facility] manager for family interest in [facility]. Chart reviewed and eligibility confirmed. Spoke with family to confirm interest and explain services. Family agreeable to transfer tomorrow; [facility] will hold a bed pending a negative Covid test. TOC aware.    Plan made with daughter Susa Raring to sign consents at [facility] in the morning.   [facility] will notify TOC when registration paperwork has been completed to arrange transport.   RN please call report to [phone].  Please send DNR form with patient.  Thank you for the opportunity to participate in this patient's care.  Chrislyn Brooke Dare, BSN, RN [facility] Liaison (listed on [facility] under Hospice/[facility])    [phone]

## 2020-07-14 NOTE — ED Triage Notes (Signed)
Pt brought to ED by [facility] for c/o not drinking, not eating, not getting her medications, multiple wounds on bilateral feet, sacrum and breast., Hx of dementia. HR 70, BP 148/82 R 20 CBG 117, 98% on 2L [place].

## 2020-07-14 NOTE — Discharge Planning (Signed)
RNCM consulted regarding SNF vs home with palliative care vs home with home health.  Palliative Care team aware and following pt.  TOC team will continue to follow for disposition needs.

## 2020-07-14 NOTE — ED Provider Notes (Signed)
Signout from Dr. Jeraldine Loots.  86 year old female history of dementia here with poor intake and increased confusion.  Patient is a DNR DNI.  She has been seen by hospice and is pending transfer to a hospice facility tomorrow.  Hospice has been written some orders for her care.  Physical Exam  BP 140/63   Pulse 84   Temp (!) 96.1 F (35.6 C) (Rectal)   Resp (!) 25   Ht 5\' 5"  (1.651 m)   Wt 45.4 kg   SpO2 100%   BMI 16.66 kg/m   Physical Exam  ED Course/Procedures     Procedures  MDM  11pm - Patient was signed out to oncoming physician Dr. with plan for transfer to hospice in a.m. if does not expire overnight.  Patient is a DNR/DNI.  Hospice has placed some orders for comfort.       Sarah Bunch, MD 06/30/2020 1052

## 2020-07-14 NOTE — Consult Note (Signed)
Consultation Note Date: 07/05/2020   Patient Name: [name]  DOB: 09-05-1924  MRN: [id number]  Age / Sex: 86 y.o., female  PCP: Merrilee Seashore, MD Referring Physician: Carmin Muskrat, MD  Reason for Consultation: establishing goals of care  HPI/Patient Profile: 86 y.o. female  with past medical history of dementia, severe aortic stenosis, hypertension, and hyperlipidemia presents to the [facility] emergency department on 07/16/2020 with complaint of not eating or drinking per her daughter. Patient also with multiple wounds to sacrum, breast, and bilateral feet.  Labs are significant for sodium-150, creatinine-1.10, albumin-2.4 Palliative care has been consulted to assist with goals of care.  Clinical Assessment and Goals of Care: I have reviewed medical records including EPIC notes, labs and imaging, met at bedside with patient and daughter [name] to discuss diagnosis, prognosis, GOC, EOL wishes, disposition, and options.  I introduced Palliative Medicine as specialized medical care for people living with serious illness. It focuses on providing relief from the symptoms and stress of a serious illness.   We discussed a brief life review of the patient. She has lived in [place] her entire life. She was briefly married at one point, had her only child [name]. 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 She was living independently up until September 2020, when she moved in with [facility]. When she was more active, patient enjoyed cooking, picking vegetables from the garden, and singing in the church choir.   As far as functional and nutritional status, she was ambulating with a walker up until about 3 weeks ago. She has been non-ambulatory and bed bound since then. She has also had very minimal intake in the last few weeks, and no intake in the last 3 days. [name] expresses it has  been increasingly difficult to care for her at home, even with home health services  Detailed discussion was had regarding the diagnosis of dementia and its natural trajectory. This includes decreased ability to communicate, ambulate, swallow, and maintain continence. I gently informed [name] that her mother is rapidly declining and is showing signs she is at end of life. [name] is tearful at this news.    The difference between aggressive medical intervention and comfort care was considered in light of the patient's goals of care. [name] wants her mother to be comfortable. Discussed transitioning to comfort care at current time, and what that would look like--keeping her clean and dry, no additional labs, no artificial hydration or feeding, no antibiotics, minimizing of medications, comfort feeds, medication for pain and dyspnea as needed. [name] agrees with this plan of care  Advanced directives, concepts specific to code status, artifical feeding and hydration, and rehospitalization were considered and discussed. [name] agrees that resuscitation should not be done in the event of cardiac arrest.   Hospice and Palliative Care services outpatient were explained and offered. [name] wants to proceed with a referral for hospice care. Discussed at home versus residential; likely she is eligible for residential given current status.  Questions and concerns were addressed.  [name] was encouraged to call with questions or concerns.   Primary decision maker: [name] (daughter)   SUMMARY OF RECOMMENDATIONS   - code status changed to DNR/DNI - TOC order entered for residential hospice, patient with severe failure to thrive - comfort measures  Code Status/Advance Care Planning:  DNR  Symptom Management:   Per end of life orders  Dilaudid prn for pain or shortness of breath  Lorazepam (ATIVAN) prn for anxiety  Glycopyrrolate (ROBINUL) for excessive secretions  Ondansetron (ZOFRAN) prn for  nausea  Palliative Prophylaxis:   Aspiration, Frequent Pain Assessment, Oral Care, Palliative Wound Care and Turn Reposition  Additional Recommendations (Limitations, Scope, Preferences):  Full Comfort Care  Psycho-social/Spiritual:   Desire for further Chaplaincy support: yes-Christian faith  Prognosis:   Days to weeks  Discharge Planning: Hospice facility      Primary Diagnoses: Present on Admission: **None**   I have reviewed the medical record, interviewed the patient and family, and examined the patient. The following aspects are pertinent.  Past Medical History:  Diagnosis Date  . Femur fracture, left ([place])    a. 10/2013 Acute oblique Fx through te midshaft of the left femur w/ anteromedial displacement, medial angulation, and ~ 10 cm foreshortening/overlap.  Marland Kitchen Hyperlipidemia   . Hypertension   . Osteoarthritis    a. 04/2005 s/p L total hip arthroplasty  . Osteopenia   . Severe aortic stenosis    a. 10/2013 Echo: EF 65-70%, mild conc LVH, Gr 1 DD, sev AS, mild to mod AI, Valve area: 0.89cm^2 (VTI), 0.85cm^2 (Vmax), mildly dil LA, mildly dil RA, PASP 34mg.    Scheduled Meds: Continuous Infusions: PRN Meds:. Medications Prior to Admission:  Prior to Admission medications   Medication Sig Start Date End Date Taking? Authorizing Provider  memantine (NAMENDA) 10 MG tablet Take 10 mg by mouth 2 (two) times daily. 06/16/19   [provider]   No Known Allergies Review of Systems  Unable to perform ROS: Dementia    Physical Exam Vitals reviewed.  Constitutional:      Appearance: She is cachectic.  Pulmonary:     Effort: Pulmonary effort is normal.  Neurological:     Mental Status: She is alert.     Comments: Oriented to self only     Vital Signs: BP 140/63   Pulse 84   Temp (!) 96.1 F (35.6 C) (Rectal)   Resp (!) 25   Ht '5\' 5"'$  (1.651 m)   Wt 45.4 kg   SpO2 100%   BMI 16.66 kg/m  Pain Scale: Faces       SpO2: SpO2: 100 % O2  Device:SpO2: 100 % O2 Flow Rate: .O2 Flow Rate (L/min): 1 L/min  LBM:   Baseline Weight: Weight: 45.4 kg Most recent weight: Weight: 45.4 kg      Palliative Assessment/Data: 10%    Time In: 15:40 Time Out: 16:50 Time Total: 70 minutes Greater than 50%  of this time was spent counseling and coordinating care related to the above assessment and plan.  Signed by: JLavena Bullion NP   Please contact Palliative Medicine Team phone at [phone] questions and concerns.  For individual provider: See AShea Evans

## 2020-07-14 NOTE — Progress Notes (Signed)
ED  CM spoke with Palliative NP regarding patient and family and end of life care goals.  [facility] residential [facility]ospice has been selected, Production manager and is processing information to transition patient to [facility]. Updated EDP and staff. Patient will be transferred via [facility].

## 2020-07-19 LAB — CULTURE, BLOOD (ROUTINE X 2)
Culture: NO GROWTH
Special Requests: ADEQUATE

## 2020-07-20 DIAGNOSIS — F0390 Unspecified dementia without behavioral disturbance: Secondary | ICD-10-CM | POA: Insufficient documentation

## 2020-07-20 DIAGNOSIS — Z7189 Other specified counseling: Secondary | ICD-10-CM | POA: Insufficient documentation

## 2020-07-20 DIAGNOSIS — Z515 Encounter for palliative care: Secondary | ICD-10-CM | POA: Insufficient documentation

## 2020-07-20 DIAGNOSIS — R627 Adult failure to thrive: Secondary | ICD-10-CM | POA: Insufficient documentation

## 2020-07-20 DIAGNOSIS — Z66 Do not resuscitate: Secondary | ICD-10-CM | POA: Insufficient documentation

## 2020-07-29 NOTE — ED Notes (Signed)
Rectal temp 92.3 bair hugger applied to pt

## 2020-07-29 NOTE — Consult Note (Signed)
WOC Nurse Consult Note: Reason for Consult:Sacral and bilateral LE wounds Wound type: Pressure and perfusion Patient is at end of life, Failure to Thrive has been documented.  Accepted to [facility] for Residential Hospice care with transfer expected today.  Conservative care guidance is provided for Nursing staff here today while boarding in ED anticipating transfer.  WOC nursing team will not follow, but will remain available to this patient, the nursing and medical teams.  Please re-consult if needed. Thanks, Ladona Mow, MSN, RN, GNP, [place]  Pager# [phone]

## 2020-07-29 NOTE — Progress Notes (Signed)
Responded to referral to support daughter of deceased patient at bedside.  Visited with patient daughter provided emotional,spiritual  and grief support. Prayer was made per family request. Chaplain   available as needed.   Venida Jarvis, [place], [facility], Pager [phone]

## 2020-07-29 NOTE — ED Provider Notes (Signed)
9:54 AM I was just informed that patient passed away around 9:18 this morning. Daughter is at bedside. Brief exam shows no respiratory effort or heartbeat.  Patient has expired.  Chaplain will be consulted to assist the daughter.   Pricilla Loveless, MD 07/13/2020 [phone]

## 2020-07-29 DEATH — deceased

## 2020-11-19 ENCOUNTER — Ambulatory Visit: Payer: Medicare Other | Admitting: Cardiology

## 2021-03-14 IMAGING — CT CT HEAD W/O CM
4 of 5 series · 15 of 47 positions shown, 17 images · non-contrast
Comparison: Head CT 08/04/2019

CLINICAL DATA: Mental status change, unknown cause. Altered mental
status

EXAM:
CT HEAD WITHOUT CONTRAST
TECHNIQUE: Contiguous axial images were obtained from the base of the skull
through the vertex without intravenous contrast.

[Series 3: head without · axial · non-contrast · 0.44mm/px · z∈[-80,+20]mm · 4 of 34 slices shown (1 of 2)]
[im 7/34  brain]
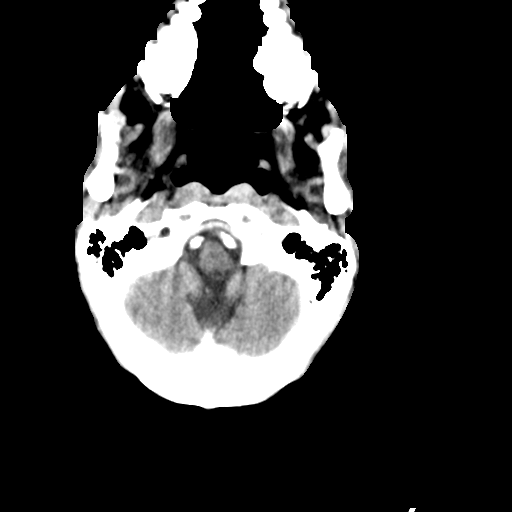
[im 14/34  brain]
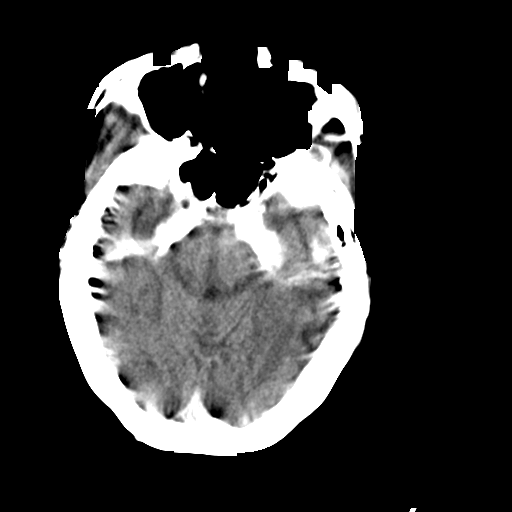
[im 20/34  brain]
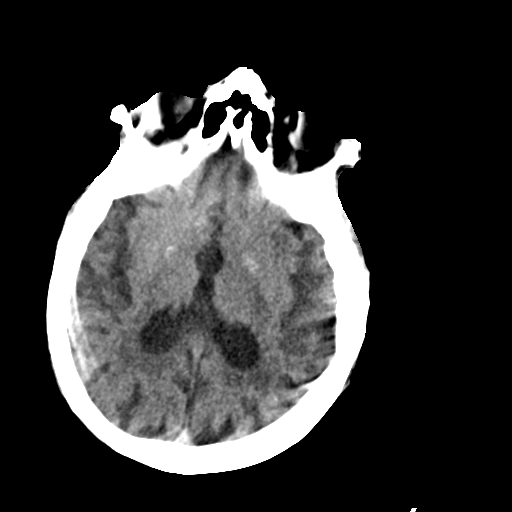
[im 27/34  brain]
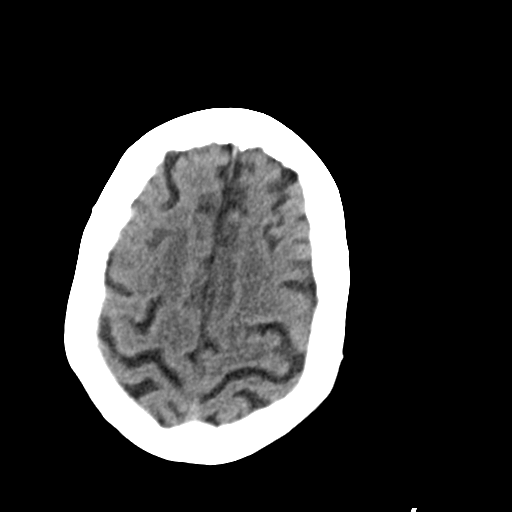

[Series 4: head without · axial · non-contrast · 0.44mm/px · z∈[-85,+25]mm · 5 of 34 slices shown, 7 images (2 of 2)]
[im 6/34  brain]
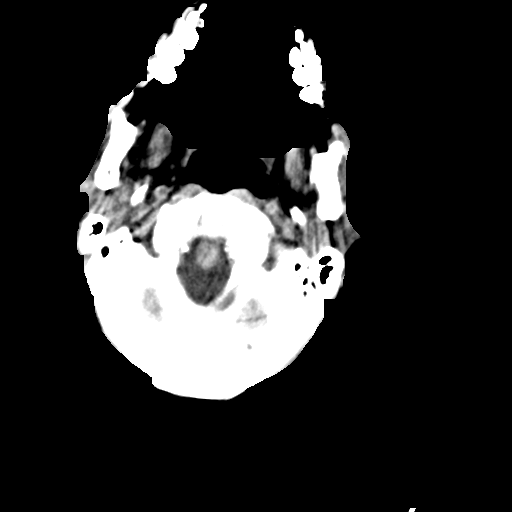
[im 6/34  bone]
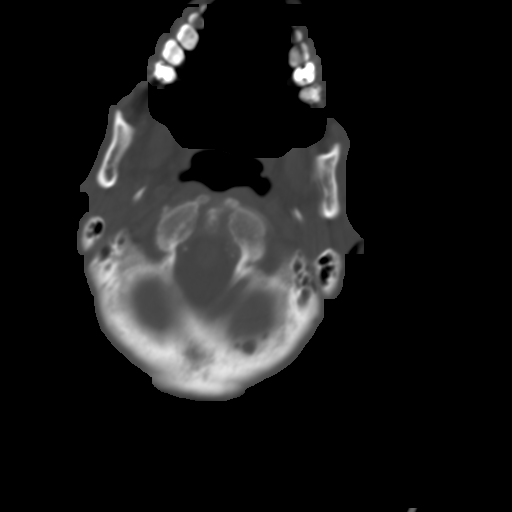
[im 12/34  brain]
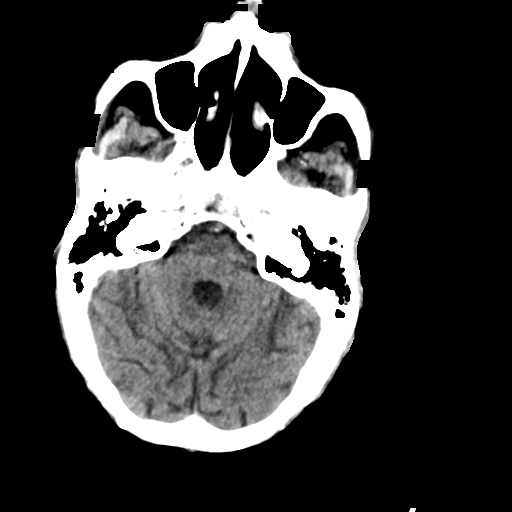
[im 17/34  brain]
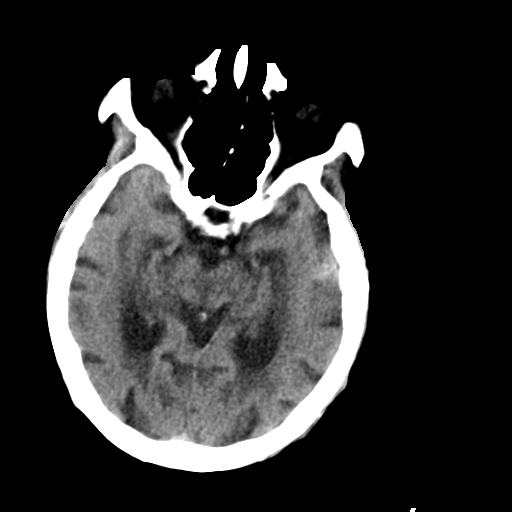
[im 23/34  brain]
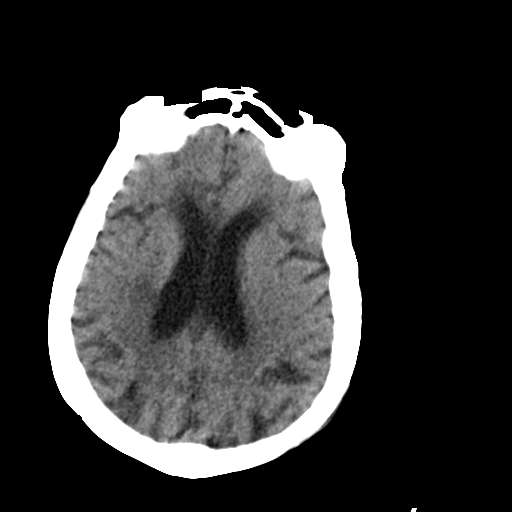
[im 28/34  brain]
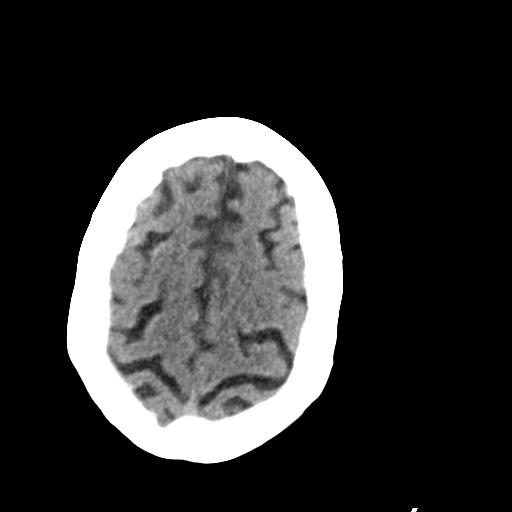
[im 28/34  bone]
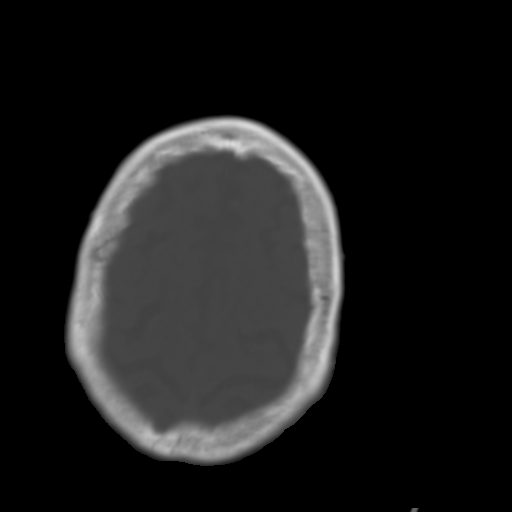

[Series 6: head without cor · coronal · non-contrast · 0.33mm/px · 3 of 67 slices shown]
[im 23/67  brain]
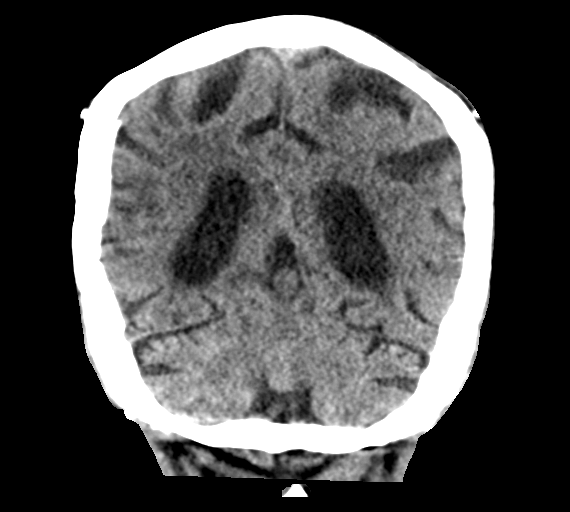
[im 30/67  brain]
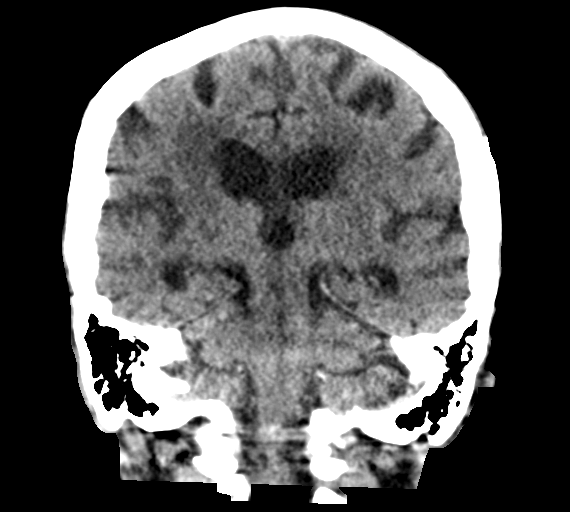
[im 37/67  brain]
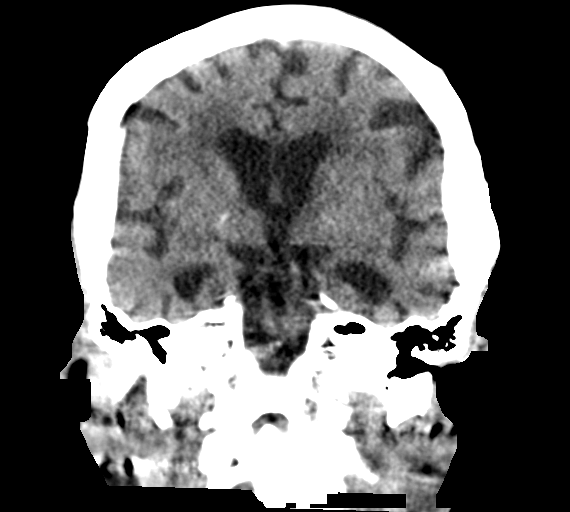

[Series 7: head without sag · sagittal · non-contrast · 0.33mm/px · 3 of 61 slices shown]
[im 21/61  brain]
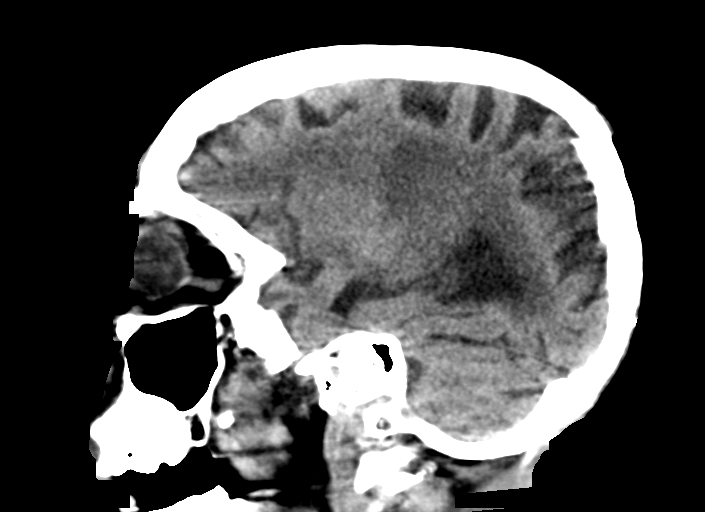
[im 31/61  brain]
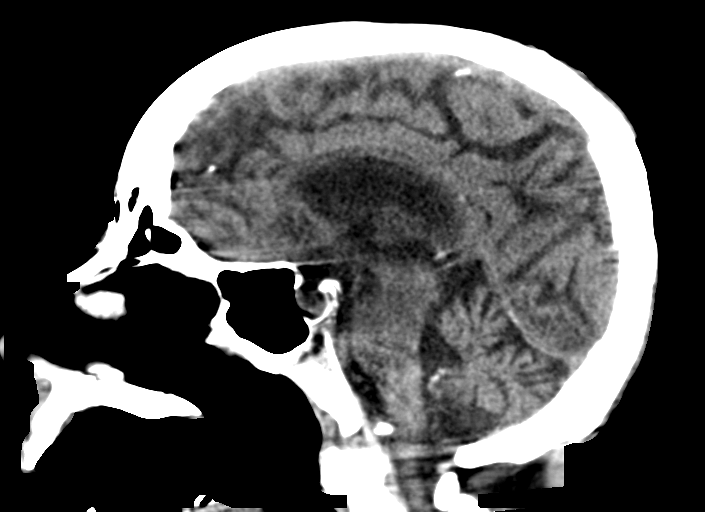
[im 41/61  brain]
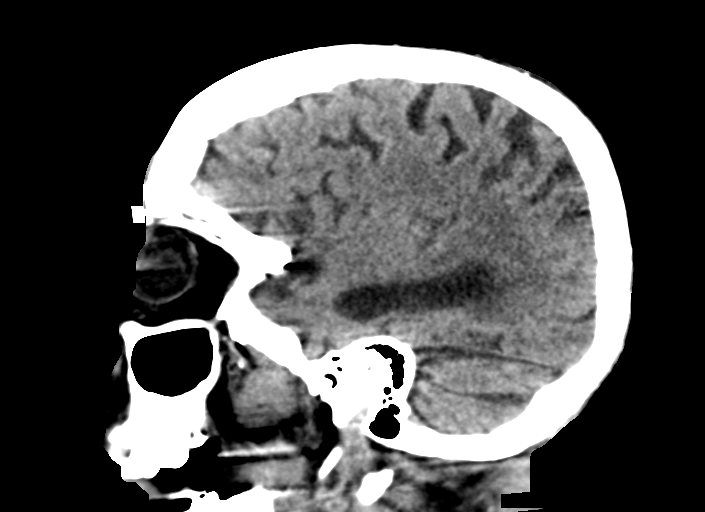

[15 of 47 positions shown; findings below may reference images not displayed]

FINDINGS: Brain:

The examination is significantly motion degraded, limiting
evaluation.

Moderate generalized parenchymal atrophy. Moderate multifocal
hypoattenuation within the cerebral white matter is nonspecific, but
consistent with chronic small vessel ischemic disease. Findings have
not appreciably changed as compared to the prior head CT of
08/04/2019.

No acute intracranial hemorrhage is identified.

No convincing cortical infarct is identified.

No extra-axial fluid collection is identified.

No evidence of intracranial mass.

No midline shift.

Vascular: No hyperdense vessel.  Atherosclerotic calcifications.

Skull: No fracture or focal suspicious osseous lesion identified.

Sinuses/Orbits: No evidence of acute orbital abnormality. No
significant paranasal sinus disease or mastoid effusion at the
imaged levels.
IMPRESSION: Motion degradation significantly limits evaluation.

Within this limitation, no acute intracranial abnormality is
identified.

Moderate generalized parenchymal atrophy and chronic small vessel
ischemic disease, similar in appearance as compared to the head CT
of 08/04/2019.
